# Patient Record
Sex: Female | Born: 1995 | Hispanic: No | Marital: Single | Smoking: Never smoker
Health system: Southern US, Community
[De-identification: ages and names within clinical notes are randomized; demographics above are authoritative.]

---
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
## 2015-01-22 ENCOUNTER — Emergency Department (HOSPITAL_COMMUNITY)
Admission: EM | Admit: 2015-01-22 | Discharge: 2015-01-22 | Discharge status: Absent without leave | Payer: Self-pay | Source: Home / Self Care

## 2015-06-22 NOTE — L&D Delivery Note (Signed)
Delivery Note At 5:30 PM a viable female was delivered via Vaginal, Spontaneous Delivery (Presentation: LOA;  ).  APGAR: 9, 9; weight 4 lb 4.1 oz (1930 g).   Placenta status: delivered whole and intact, with 3-vessel cord without complications.  Anesthesia: none  Episiotomy: None Lacerations:  1st degree, hemostatic Suture Repair: n/a Est. Blood Loss (mL):  100  Mom to postpartum.  Baby to NICU due to low birth weight.  Alyss Granato 06/13/2016, 6:01 PM

## 2015-11-23 ENCOUNTER — Encounter (HOSPITAL_COMMUNITY): Payer: Self-pay

## 2015-11-23 DIAGNOSIS — Z3A01 Less than 8 weeks gestation of pregnancy: Secondary | ICD-10-CM | POA: Diagnosis not present

## 2015-11-23 DIAGNOSIS — O219 Vomiting of pregnancy, unspecified: Secondary | ICD-10-CM | POA: Diagnosis not present

## 2015-11-23 DIAGNOSIS — O209 Hemorrhage in early pregnancy, unspecified: Secondary | ICD-10-CM | POA: Diagnosis present

## 2015-11-23 DIAGNOSIS — Z79899 Other long term (current) drug therapy: Secondary | ICD-10-CM | POA: Diagnosis not present

## 2015-11-23 LAB — I-STAT BETA HCG BLOOD, ED (MC, WL, AP ONLY)

## 2015-11-23 NOTE — ED Notes (Signed)
Pt complaining of vaginal bleeding x 30 mins. Pt states she is [redacted] weeks pregnant. Pt complaining of lower abdominal pain.

## 2015-11-24 ENCOUNTER — Emergency Department (HOSPITAL_COMMUNITY)
Admission: EM | Admit: 2015-11-24 | Discharge: 2015-11-24 | Disposition: A | Discharge status: Home / Self Care | Payer: Medicaid Other | Attending: Emergency Medicine | Admitting: Emergency Medicine

## 2015-11-24 ENCOUNTER — Emergency Department (HOSPITAL_COMMUNITY): Payer: Medicaid Other

## 2015-11-24 DIAGNOSIS — R58 Hemorrhage, not elsewhere classified: Secondary | ICD-10-CM

## 2015-11-24 LAB — WET PREP, GENITAL
CLUE CELLS WET PREP: NONE SEEN
SPERM: NONE SEEN
TRICH WET PREP: NONE SEEN
Yeast Wet Prep HPF POC: NONE SEEN

## 2015-11-24 LAB — GC/CHLAMYDIA PROBE AMP (~~LOC~~) NOT AT ARMC
Chlamydia: NEGATIVE
Neisseria Gonorrhea: NEGATIVE

## 2015-11-24 NOTE — ED Notes (Signed)
Pt in US

## 2015-11-24 NOTE — ED Provider Notes (Signed)
CSN: [id number]     Arrival date & time 11/23/15  2245 History  By signing my name below, I, Marisue Humble, attest that this documentation has been prepared under the direction and in the presence of Tomasita Crumble, MD . Electronically Signed: Marisue Humble, Scribe. 11/24/2015. 1:58 AM.   Chief Complaint  Patient presents with  . Vaginal Bleeding  . Possible Pregnancy    The history is provided by the patient. No language interpreter was used.   HPI Comments:  Barbara Mckinney is a 20 y.o. female with no pertinent PMHx who presents to the Emergency Department complaining of vaginal bleeding onset ~3 hours ago, currently alleviated. She noticed the bleeding with urination but notes it decreases when sitting down. Pt reports associated lower abdominal pain, nausea and vomiting. No alleviating factors noted. She is [redacted] weeks pregnant; this is her first pregnancy. Denies abnormal vaginal discharge.    History reviewed. No pertinent past medical history. Past Surgical History  Procedure Laterality Date  . Laparoscopy N/A 01/07/2013    Procedure: LAPAROSCOPY DIAGNOSTIC;  Surgeon: Robyne Askew, MD;  [facility];  Service: General;  Laterality: N/A;  . 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 No pertinent family history. Social History  Substance Use Topics  . Smoking status: Never Smoker   . Smokeless tobacco: None  . Alcohol Use: No   OB History    No data available     Review of Systems  Gastrointestinal: Positive for nausea, vomiting and abdominal pain.  Genitourinary: Positive for vaginal bleeding. Negative for vaginal discharge.  All other systems reviewed and are negative.   Allergies  Bacitracin  Home Medications   Prior to Admission medications   Medication Sig Start Date End Date Taking?  Authorizing Provider  Prenatal Vit-Fe Fumarate-FA (PRENATAL MULTIVITAMIN) TABS tablet Take 1 tablet by mouth daily at 12 noon.   Yes Historical Provider, MD   BP 120/81 mmHg  Pulse 86  Temp(Src) 98.8 F (37.1 C) (Oral)  Resp 12  Ht  (1.575 m)  Wt 102 lb (46.267 kg)  BMI 18.65 kg/m2  SpO2 100%   Physical Exam  Constitutional: She is oriented to person, place, and time. She appears well-developed and well-nourished. No distress.  HENT:  Head: Normocephalic and atraumatic.  Nose: Nose normal.  Mouth/Throat: Oropharynx is clear and moist. No oropharyngeal exudate.  Eyes: Conjunctivae and EOM are normal. Pupils are equal, round, and reactive to light. No scleral icterus.  Neck: Normal range of motion. Neck supple. No JVD present. No tracheal deviation present. No thyromegaly present.  Cardiovascular: Normal rate, regular rhythm and normal heart sounds.  Exam reveals no gallop and no friction rub.   No murmur heard. Pulmonary/Chest: Effort normal and breath sounds normal. No respiratory distress. She has no wheezes. She exhibits no tenderness.  Abdominal: Soft. Bowel sounds are normal. She exhibits no distension and no mass. There is no tenderness. There is no rebound and no guarding.  mildline abdominal scar, well healed  Genitourinary:  Moderate vaginal bleeding coming from cervical os  Musculoskeletal: Normal range of motion. She exhibits no edema or tenderness.  Lymphadenopathy:    She has no cervical adenopathy.  Neurological: She is alert and oriented to person, place, and time. No cranial nerve deficit. She exhibits normal muscle tone.  Skin: Skin is warm and dry. No rash noted. No erythema. No pallor.  Psychiatric: She has a normal mood and affect. Her behavior is normal.  Nursing note and vitals reviewed.   ED Course  Procedures  DIAGNOSTIC STUDIES:  Oxygen Saturation is 100% on RA, normal by my interpretation.    COORDINATION OF CARE:  1:42 AM Performed pelvic  exam. Discussed treatment plan with pt at bedside and pt agreed to plan.  Labs Review Labs Reviewed  WET PREP, GENITAL - Abnormal; Notable for the following:    WBC, Wet Prep HPF POC FEW (*)    All other components within normal limits  I-STAT BETA HCG BLOOD, ED ([facility], [facility], [facility] ONLY) - Abnormal; Notable for the following:    I-stat hCG, quantitative >2000.0 (*)    All other components within normal limits  GC/CHLAMYDIA PROBE AMP ([place]) NOT AT [facility]    Imaging Review [place] Ob Comp Less 14 Wks  11/24/2015  CLINICAL DATA:  20 year old female with positive HCG levels and vaginal bleeding EXAM: OBSTETRIC <14 WK US AND TRANSVAGINAL OB US TECHNIQUE: Both transabdominal and transvaginal ultrasound examinations were performed for complete evaluation of the gestation as well as the maternal uterus, adnexal regions, and pelvic cul-de-sac. Transvaginal technique was performed to assess early pregnancy. COMPARISON:  None for this pregnancy FINDINGS: Intrauterine gestational sac: Single intrauterine gestational sac. Yolk sac:  Seen Embryo:  Present Cardiac Activity: Detected Heart Rate: 144  bpm CRL:  11  Mm   7 w   2 d                  US EDC: 07/10/2016 Subchorionic hemorrhage: There is a small hypoechoic to anechoic area superior to the gestational sac with apparent partial separation from the gestational sac by a septum. This may represent a small subarachnoid hemorrhage or a band within the gestational sac. Maternal uterus/adnexae: The maternal ovaries appear unremarkable. There is a 2.0 x 1.5 x 1.5 cm hypoechoic structure in the right ovary, likely a corpus luteum. No free fluid noted. IMPRESSION: Single live intrauterine pregnancy with an estimated gestational age of [redacted] weeks, 2 days. Small subchorionic hemorrhage versus septation/ band within the superior aspect of the gestational sac. Follow-up recommended. Electronically Signed   By: Elgie CollardArash  Radparvar M.D.   On: 11/24/2015 01:30   [place] Ob  Transvaginal  11/24/2015  CLINICAL DATA:  20 year old female with positive HCG levels and vaginal bleeding EXAM: OBSTETRIC <14 WK US AND TRANSVAGINAL OB US TECHNIQUE: Both transabdominal and transvaginal ultrasound examinations were performed for complete evaluation of the gestation as well as the maternal uterus, adnexal regions, and pelvic cul-de-sac. Transvaginal technique was performed to assess early pregnancy. COMPARISON:  None for this pregnancy FINDINGS: Intrauterine gestational sac: Single intrauterine gestational sac. Yolk sac:  Seen Embryo:  Present Cardiac Activity: Detected Heart Rate: 144  bpm CRL:  11  Mm   7 w   2 d                  US EDC: 07/10/2016 Subchorionic hemorrhage: There is a small hypoechoic to anechoic area superior to the gestational sac with apparent partial separation from the gestational sac by a septum. This may represent a small subarachnoid hemorrhage or a band within the gestational sac. Maternal uterus/adnexae: The maternal ovaries appear unremarkable. There is a 2.0 x 1.5 x 1.5 cm hypoechoic structure in the right ovary, likely a corpus luteum. No free fluid noted. IMPRESSION: Single live intrauterine pregnancy  with an estimated gestational age of [redacted] weeks, 2 days. Small subchorionic hemorrhage versus septation/ band within the superior aspect of the gestational sac. Follow-up recommended. Electronically Signed   By: Elgie Collard M.D.   On: 11/24/2015 01:30   I have personally reviewed and evaluated these images and lab results as part of my medical decision-making.   EKG Interpretation None      MDM   Final diagnoses:  Bleeding    Patient presents to the ED for vag bleeding in the setting of pregnancy.  PE is unremarkable other than visualizing blood.  US reveal subchorionic hemorrhage which was explained to the patient.  OB fu provided. She appears well and in NAD. Vs remain within her normal limits and she is safe for DC.   I personally performed the  services described in this documentation, which was scribed in my presence. The recorded information has been reviewed and is accurate.     Tomasita Crumble, MD 11/24/15 [phone]

## 2015-11-24 NOTE — Discharge Instructions (Signed)
Vaginal Bleeding During Pregnancy, First Trimester Ms. Barbara Mckinney, your ultrasound reveals a live fetus in your uterus.  See an OB doctor within 3 days for close follow up.  If symptoms worsen, come back to the ED immediately. Thank you. A small amount of bleeding (spotting) from the vagina is common in early pregnancy. Sometimes the bleeding is normal and is not a problem, and sometimes it is a sign of something serious. Be sure to tell your doctor about any bleeding from your vagina right away. HOME CARE  Watch your condition for any changes.  Follow your doctor's instructions about how active you can be.  If you are on bed rest:  You may need to stay in bed and only get up to use the bathroom.  You may be allowed to do some activities.  If you need help, make plans for someone to help you.  Write down:  The number of pads you use each day.  How often you change pads.  How soaked (saturated) your pads are.  Do not use tampons.  Do not douche.  Do not have sex or orgasms until your doctor says it is okay.  If you pass any tissue from your vagina, save the tissue so you can show it to your doctor.  Only take medicines as told by your doctor.  Do not take aspirin because it can make you bleed.  Keep all follow-up visits as told by your doctor. GET HELP IF:   You bleed from your vagina.  You have cramps.  You have labor pains.  You have a fever that does not go away after you take medicine. GET HELP RIGHT AWAY IF:   You have very bad cramps in your back or belly (abdomen).  You pass large clots or tissue from your vagina.  You bleed more.  You feel light-headed or weak.  You pass out (faint).  You have chills.  You are leaking fluid or have a gush of fluid from your vagina.  You pass out while pooping (having a bowel movement). MAKE SURE YOU:  Understand these instructions.  Will watch your condition.  Will get help right away if you are not doing well  or get worse.   This information is not intended to replace advice given to you by your health care provider. Make sure you discuss any questions you have with your health care provider.   Document Released: 10/22/2013 Document Reviewed: 10/22/2013 Elsevier Interactive Patient Education [employer]2016 Elsevier Inc.

## 2015-11-27 ENCOUNTER — Encounter (HOSPITAL_COMMUNITY): Payer: Self-pay

## 2015-11-27 DIAGNOSIS — Z3A01 Less than 8 weeks gestation of pregnancy: Secondary | ICD-10-CM | POA: Diagnosis not present

## 2015-11-27 DIAGNOSIS — Z79899 Other long term (current) drug therapy: Secondary | ICD-10-CM | POA: Diagnosis not present

## 2015-11-27 DIAGNOSIS — O209 Hemorrhage in early pregnancy, unspecified: Secondary | ICD-10-CM | POA: Insufficient documentation

## 2015-11-27 LAB — COMPREHENSIVE METABOLIC PANEL
ALK PHOS: 56 U/L (ref 38–126)
ALT: 11 U/L — AB (ref 14–54)
AST: 16 U/L (ref 15–41)
Albumin: 3.9 g/dL (ref 3.5–5.0)
Anion gap: 5 (ref 5–15)
BILIRUBIN TOTAL: 0.8 mg/dL (ref 0.3–1.2)
BUN: 6 mg/dL (ref 6–20)
CO2: 23 mmol/L (ref 22–32)
Calcium: 9.2 mg/dL (ref 8.9–10.3)
Chloride: 104 mmol/L (ref 101–111)
Creatinine, Ser: 0.57 mg/dL (ref 0.44–1.00)
GFR calc Af Amer: >60 mL/min (ref >60)
GFR calc non Af Amer: >60 mL/min (ref >60)
GLUCOSE: 149 mg/dL — AB (ref 65–99)
Potassium: 3.5 mmol/L (ref 3.5–5.1)
Sodium: 132 mmol/L — ABNORMAL LOW (ref 135–145)
TOTAL PROTEIN: 6.8 g/dL (ref 6.5–8.1)

## 2015-11-27 LAB — LIPASE, BLOOD: Lipase: 26 U/L (ref 11–51)

## 2015-11-27 LAB — URINE MICROSCOPIC-ADD ON: WBC UA: NONE SEEN WBC/hpf (ref 0–5)

## 2015-11-27 LAB — URINALYSIS, ROUTINE W REFLEX MICROSCOPIC
Bilirubin Urine: NEGATIVE
Glucose, UA: NEGATIVE mg/dL
Ketones, ur: NEGATIVE mg/dL
Leukocytes, UA: NEGATIVE
NITRITE: NEGATIVE
PH: 6 (ref 5.0–8.0)
PROTEIN: NEGATIVE mg/dL
Specific Gravity, Urine: 1.019 (ref 1.005–1.030)

## 2015-11-27 LAB — CBC
HCT: 35.8 % — ABNORMAL LOW (ref 36.0–46.0)
Hemoglobin: 12.2 g/dL (ref 12.0–15.0)
MCH: 29.5 pg (ref 26.0–34.0)
MCHC: 34.1 g/dL (ref 30.0–36.0)
MCV: 86.7 fL (ref 78.0–100.0)
Platelets: 222 10*3/uL (ref 150–400)
RBC: 4.13 MIL/uL (ref 3.87–5.11)
RDW: 11.8 % (ref 11.5–15.5)
WBC: 7.6 10*3/uL (ref 4.0–10.5)

## 2015-11-27 LAB — I-STAT BETA HCG BLOOD, ED (MC, WL, AP ONLY): I-stat hCG, quantitative: >2000 m[IU]/mL — ABNORMAL HIGH (ref <5)

## 2015-11-27 NOTE — ED Notes (Signed)
Pt reports she is [redacted] weeks pregnant and tonight around 9pm she started to experience vaginal bleeding "like a normal period" associated with abdominal pain. LMP in March.

## 2015-11-28 ENCOUNTER — Emergency Department (HOSPITAL_COMMUNITY): Payer: Medicaid Other

## 2015-11-28 ENCOUNTER — Emergency Department (HOSPITAL_COMMUNITY)
Admission: EM | Admit: 2015-11-28 | Discharge: 2015-11-28 | Disposition: A | Discharge status: Home / Self Care | Payer: Medicaid Other | Attending: Emergency Medicine | Admitting: Emergency Medicine

## 2015-11-28 DIAGNOSIS — O209 Hemorrhage in early pregnancy, unspecified: Secondary | ICD-10-CM

## 2015-11-28 DIAGNOSIS — N939 Abnormal uterine and vaginal bleeding, unspecified: Secondary | ICD-10-CM

## 2015-11-28 NOTE — ED Provider Notes (Signed)
CSN: [id number]     Arrival date & time 11/27/15  2126 History  By signing my name below, I, Linna Darner, attest that this documentation has been prepared under the direction and in the presence of physician practitioner, Gwyneth Sprout, MD. Electronically Signed: Linna Darner, Scribe. 11/28/2015. 2:02 AM.    Chief Complaint  Patient presents with  . Vaginal Bleeding    The history is provided by the patient. No language interpreter was used.     HPI Comments: Barbara Mckinney is a 20 y.o. female who presents to the Emergency Department complaining of sudden onset, intermittent, vaginal bleeding beginning several hours ago. She is [redacted] weeks pregnant. She states that she was seen here a few days ago for the same reason; she notes that her vaginal bleeding stopped after that visit but started again tonight. She reports that her bleeding is "like a normal period." She reports that she is not bleeding currently. Pt states that her abdomen is not in pain currently but she experienced abdominal pain earlier tonight. She notes associated nausea as well. Pt had not exerted herself or had sexual intercourse prior to onset. She has no h/o prior pregnancy. Her LNMP was in March of this year. She denies vomiting or any other associated symptoms.  History reviewed. No pertinent past medical history. Past Surgical History  Procedure Laterality Date  . Laparoscopy N/A 01/07/2013    Procedure: LAPAROSCOPY DIAGNOSTIC;  Surgeon: Robyne Askew, MD;  Location: [facility] OR;  Service: General;  Laterality: N/A;  . 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 Social History  Substance Use Topics  . Smoking status: Never Smoker   . Smokeless tobacco: None  . Alcohol Use: No   OB History    Gravida Para Term Preterm AB TAB SAB Ectopic  Multiple Living   1              Review of Systems  A complete 10 system review of systems was obtained and all systems are negative except as noted in the HPI and PMH.   Allergies  Bacitracin  Home Medications   Prior to Admission medications   Medication Sig Start Date End Date Taking? Authorizing Provider  Prenatal Vit-Fe Fumarate-FA (PRENATAL MULTIVITAMIN) TABS tablet Take 1 tablet by mouth daily at 12 noon.   Yes Historical Provider, MD   BP 107/80 mmHg  Pulse 74  Temp(Src) 98 F (36.7 C) (Oral)  Resp 22  Ht 5\' 2"  (1.575 m)  Wt 101 lb 3 oz (45.898 kg)  BMI 18.50 kg/m2  SpO2 100% Physical Exam  Constitutional: She is oriented to person, place, and time. She appears well-developed and well-nourished. No distress.  HENT:  Head: Normocephalic and atraumatic.  Eyes: Conjunctivae and EOM are normal.  Neck: Neck supple. No tracheal deviation present.  Cardiovascular: Normal rate, regular rhythm and normal heart sounds.   Pulmonary/Chest: Effort normal and breath sounds normal. No respiratory distress.  Abdominal: Bowel sounds are normal. She exhibits no distension and no mass. There is no tenderness. There is no rebound and no guarding.  Genitourinary: Vagina normal.  Musculoskeletal: Normal range of motion.  Neurological: She is alert and oriented to person, place, and time.  Skin: Skin is warm and dry.  Psychiatric: She has a normal mood and affect. Her  behavior is normal.  Nursing note and vitals reviewed.   ED Course  Procedures (including critical care time)  DIAGNOSTIC STUDIES: Oxygen Saturation is 100% on RA, normal by my interpretation.    COORDINATION OF CARE: 2:02 AM Discussed treatment plan with pt at bedside and pt agreed to plan.  Labs Review Labs Reviewed  COMPREHENSIVE METABOLIC PANEL - Abnormal; Notable for the following:    Sodium 132 (*)    Glucose, Bld 149 (*)    ALT 11 (*)    All other components within normal limits  CBC - Abnormal;  Notable for the following:    HCT 35.8 (*)    All other components within normal limits  URINALYSIS, ROUTINE W REFLEX MICROSCOPIC (NOT AT [facility]) - Abnormal; Notable for the following:    Hgb urine dipstick LARGE (*)    All other components within normal limits  URINE MICROSCOPIC-ADD ON - Abnormal; Notable for the following:    Squamous Epithelial / LPF 0-5 (*)    Bacteria, UA RARE (*)    All other components within normal limits  I-STAT BETA HCG BLOOD, ED ([facility], [facility], [facility] ONLY) - Abnormal; Notable for the following:    I-stat hCG, quantitative >2000.0 (*)    All other components within normal limits  LIPASE, BLOOD    Imaging Review [place] Ob Transvaginal  11/28/2015  CLINICAL DATA:  20 year old pregnant female with vaginal bleeding. EXAM: TRANSVAGINAL OB ULTRASOUND TECHNIQUE: Transvaginal ultrasound was performed for complete evaluation of the gestation as well as the maternal uterus, adnexal regions, and pelvic cul-de-sac. COMPARISON:  Ultrasound dated 11/24/2015 FINDINGS: Intrauterine gestational sac: Single intrauterine gestational sac. Yolk sac:  Seen Embryo:  Present Cardiac Activity: Detected Heart Rate: 169 bpm CRL:   17  mm   8 w 1 d                  US EDC: 07/08/2016 Subchorionic hemorrhage: Small subchorionic hemorrhage measuring approximately 4.5 x 4.5 mm . Maternal uterus/adnexae: The right ovary measures 3.2 x 2.5 x 2.3 cm. There is a 1.8 x 1.4 x 1.7 cm corpus luteum in the right ovary. The left ovary measures 2.4 x 1.4 x 1.7 cm and appears unremarkable. IMPRESSION: Single live intrauterine pregnancy with an estimated gestational age of [redacted] weeks, 6 days based on first ultrasound dated 11/24/2015. Small subchorionic hemorrhage, decreased in size compared to prior study. Electronically Signed   By: Elgie CollardArash  Radparvar M.D.   On: 11/28/2015 01:45   I have personally reviewed and evaluated these images and lab results as part of my medical decision-making.   EKG Interpretation None      MDM    Final diagnoses:  Vaginal bleeding in pregnancy, first trimester    Patient is a healthy 20 year old female who was seen several days ago and diagnosed with IUP and first trimester vaginal bleeding returning today due to heavy bleeding and mild abdominal cramping. Currently patient has no bleeding and no cramping at this time. Labs and ultrasound ordered prior to my examination showed a single live intrauterine pregnancy with gestational age of [redacted] weeks 6 days with a small subchorionic hemorrhage. Patient has no abdominal pain on exam currently she recently had a pelvic exam and do not feel she needs repeated at this time. No evidence of ectopic pregnancy. HCG is still greater than 2000. Patient was encouraged to follow-up with women's clinic and she was discharged home.  I personally performed the services described in this documentation, which was scribed in  my presence.  The recorded information has been reviewed and considered.   Gwyneth Sprout, MD 11/28/15 [phone]

## 2015-11-28 NOTE — ED Notes (Signed)
Patient transported to Ultrasound 

## 2015-12-19 ENCOUNTER — Encounter (HOSPITAL_COMMUNITY): Payer: Self-pay | Admitting: *Deleted

## 2015-12-19 ENCOUNTER — Emergency Department (HOSPITAL_COMMUNITY)
Admission: EM | Admit: 2015-12-19 | Discharge: 2015-12-20 | Disposition: A | Discharge status: Home / Self Care | Payer: Medicaid Other | Attending: Emergency Medicine | Admitting: Emergency Medicine

## 2015-12-19 DIAGNOSIS — O210 Mild hyperemesis gravidarum: Secondary | ICD-10-CM | POA: Insufficient documentation

## 2015-12-19 DIAGNOSIS — Z3A11 11 weeks gestation of pregnancy: Secondary | ICD-10-CM | POA: Diagnosis not present

## 2015-12-19 LAB — BASIC METABOLIC PANEL
Anion gap: 9 (ref 5–15)
BUN: 6 mg/dL (ref 6–20)
CALCIUM: 9.2 mg/dL (ref 8.9–10.3)
CHLORIDE: 103 mmol/L (ref 101–111)
CO2: 23 mmol/L (ref 22–32)
CREATININE: 0.58 mg/dL (ref 0.44–1.00)
GFR calc non Af Amer: >60 mL/min (ref >60)
Glucose, Bld: 89 mg/dL (ref 65–99)
Potassium: 3.4 mmol/L — ABNORMAL LOW (ref 3.5–5.1)
SODIUM: 135 mmol/L (ref 135–145)

## 2015-12-19 MED ORDER — SODIUM CHLORIDE 0.9 % IV BOLUS (SEPSIS)
1000.0000 mL | Freq: Once | INTRAVENOUS | Status: AC
  Administered 2015-12-19: 1000 mL via INTRAVENOUS

## 2015-12-19 MED ORDER — ONDANSETRON HCL 4 MG/2ML IJ SOLN
4.0000 mg | Freq: Once | INTRAMUSCULAR | Status: AC
  Administered 2015-12-19: 4 mg via INTRAVENOUS
  Filled 2015-12-19: qty 2

## 2015-12-19 NOTE — ED Notes (Signed)
THE PT IS ALSO C/O A HEADACHE

## 2015-12-19 NOTE — ED Notes (Signed)
THE PT IS C/O DIZZINESS AND EMESIS FOR 2 WEEKS. SHE IS [redacted] WEEKS PREGNANT BUT HAS NOT BEEN ABLE TO MAKE A OB APPOINTMENT.  SHE WAS SEEN AT [facility] AND WAS TOLD TO CALL THE CLINIC BUT THEY CALLED AND NO APPOINTMENTS WERE AVAILABLE  LMP [place]

## 2015-12-20 MED ORDER — ONDANSETRON 4 MG PO TBDP: 4.0000 mg | ORAL_TABLET | Freq: Three times a day (TID) | ORAL | Status: DC | PRN

## 2015-12-20 NOTE — ED Notes (Signed)
Pt stable, ambulatory, states understanding of discharge instructions 

## 2015-12-20 NOTE — Discharge Instructions (Signed)
Hyperemesis Gravidarum  Hyperemesis gravidarum is a severe form of nausea and vomiting that happens during pregnancy. Hyperemesis is worse than morning sickness. It may cause you to have nausea or vomiting all day for many days. It may keep you from eating and drinking enough food and liquids. Hyperemesis usually occurs during the first half (the first 20 weeks) of pregnancy. It often goes away once a woman is in her second half of pregnancy. However, sometimes hyperemesis continues through an entire pregnancy.   CAUSES   The cause of this condition is not completely known but is thought to be related to changes in the body's hormones when pregnant. It could be from the high level of the pregnancy hormone or an increase in estrogen in the body.   SIGNS AND SYMPTOMS    Severe nausea and vomiting.   Nausea that does not go away.   Vomiting that does not allow you to keep any food down.   Weight loss and body fluid loss (dehydration).   Having no desire to eat or not liking food you have previously enjoyed.  DIAGNOSIS   Your health care provider will do a physical exam and ask you about your symptoms. He or she may also order blood tests and urine tests to make sure something else is not causing the problem.   TREATMENT   You may only need medicine to control the problem. If medicines do not control the nausea and vomiting, you will be treated in the hospital to prevent dehydration, increased acid in the blood (acidosis), weight loss, and changes in the electrolytes in your body that may harm the unborn baby (fetus). You may need IV fluids.   HOME CARE INSTRUCTIONS    Only take over-the-counter or prescription medicines as directed by your health care provider.   Try eating a couple of dry crackers or toast in the morning before getting out of bed.   Avoid foods and smells that upset your stomach.   Avoid fatty and spicy foods.   Eat 5-6 small meals a day.   Do not drink when eating meals. Drink between  meals.   For snacks, eat high-protein foods, such as cheese.   Eat or suck on things that have ginger in them. Ginger helps nausea.   Avoid food preparation. The smell of food can spoil your appetite.   Avoid iron pills and iron in your multivitamins until after 3-4 months of being pregnant. However, consult with your health care provider before stopping any prescribed iron pills.  SEEK MEDICAL CARE IF:    Your abdominal pain increases.   You have a severe headache.   You have vision problems.   You are losing weight.  SEEK IMMEDIATE MEDICAL CARE IF:    You are unable to keep fluids down.   You vomit blood.   You have constant nausea and vomiting.   You have excessive weakness.   You have extreme thirst.   You have dizziness or fainting.   You have a fever or persistent symptoms for more than 2-3 days.   You have a fever and your symptoms suddenly get worse.  MAKE SURE YOU:    Understand these instructions.   Will watch your condition.   Will get help right away if you are not doing well or get worse.     This information is not intended to replace advice given to you by your health care provider. Make sure you discuss any questions you have with   your health care provider.     Document Released: 06/07/2005 Document Revised: 03/28/2013 Document Reviewed: 01/17/2013  Elsevier Interactive Patient Education 2016 Elsevier Inc.

## 2015-12-25 NOTE — ED Provider Notes (Signed)
CSN: [id number]     Arrival date & time 12/19/15  1844 History   First MD Initiated Contact with Patient 12/19/15 2235     Chief Complaint  Patient presents with  . Emesis     (Consider location/radiation/quality/duration/timing/severity/associated sxs/prior Treatment) HPI Comments: The patient presents with nausea and vomiting that has been persistent over 2 weeks. She states she is about [redacted] weeks pregnant and, so far, has not started prenatal care. No fever. She had some vaginal bleeding and was evaluated for same on 11/28/15 and reports a normal ultrasound. No further vaginal bleeding. No vaginal discharge or abdominal pain. She has been lightheaded/dizzy without syncope or near syncope. No SOB or chest pain. No diarrhea.  The history is provided by the patient. No language interpreter was used.    History reviewed. No pertinent past medical history. Past Surgical History  Procedure Laterality Date  . Laparoscopy N/A 01/07/2013    Procedure: LAPAROSCOPY DIAGNOSTIC;  Surgeon: Robyne AskewPaul S Toth III, MD;  [facility];  Service: General;  Laterality: N/A;  . 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 Social History  Substance Use Topics  . Smoking status: Never Smoker   . Smokeless tobacco: None  . Alcohol Use: No   OB History    Gravida Para Term Preterm AB TAB SAB Ectopic Multiple Living   1              Review of Systems  Constitutional: Negative for fever and chills.  HENT: Negative.   Respiratory: Negative.  Negative for shortness of breath.   Cardiovascular: Negative.   Gastrointestinal: Positive for nausea and vomiting. Negative for abdominal pain and diarrhea.  Genitourinary: Negative for dysuria, vaginal bleeding and vaginal discharge.  Musculoskeletal: Negative.  Negative for myalgias.   Skin: Negative.  Negative for color change and pallor.  Neurological: Positive for dizziness and light-headedness. Negative for syncope.      Allergies  Bacitracin  Home Medications   Prior to Admission medications   Medication Sig Start Date End Date Taking? Authorizing Provider  ondansetron (ZOFRAN ODT) 4 MG disintegrating tablet Take 1 tablet (4 mg total) by mouth every 8 (eight) hours as needed for nausea or vomiting. 12/20/15   Alayziah Tangeman, PA-C   BP 107/83 mmHg  Pulse 72  Temp(Src) 98.2 F (36.8 C) (Oral)  Resp 19  Ht 5\' 2"  (1.575 m)  Wt 43.817 kg  BMI 17.66 kg/m2  SpO2 100% Physical Exam  Constitutional: She is oriented to person, place, and time. She appears well-developed and well-nourished.  HENT:  Head: Normocephalic.  Neck: Normal range of motion. Neck supple.  Cardiovascular: Normal rate and regular rhythm.   Pulmonary/Chest: Effort normal and breath sounds normal. She has no wheezes. She has no rales.  Abdominal: Soft. Bowel sounds are normal. There is no tenderness. There is no rebound and no guarding.  Musculoskeletal: Normal range of motion.  Neurological: She is alert and oriented to person, place, and time.  Skin: Skin is warm and dry. No rash noted.  Psychiatric: She has a normal mood and affect.    ED Course  Procedures (including critical care time) Labs Review Labs Reviewed  BASIC METABOLIC PANEL - Abnormal; Notable for the following:    Potassium 3.4 (*)    All other components within  normal limits    Imaging Review No results found. I have personally reviewed and evaluated these images and lab results as part of my medical decision-making.   EKG Interpretation None      MDM   Final diagnoses:  Hyperemesis gravidarum    IVF's and Zofran provided with improvement in symptoms. She is tolerating PO fluids. She is encouraged to seek prenatal care and an additional referral was given to her at discharge. She states she feels improved  enough to be comfortable with discharge home.     Elpidio AnisShari Jazzmen Restivo, PA-C 12/25/15 0032  Loren Raceravid Yelverton, MD 12/26/15 [phone]

## 2016-06-10 ENCOUNTER — Encounter (HOSPITAL_COMMUNITY): Payer: Self-pay

## 2016-06-10 ENCOUNTER — Inpatient Hospital Stay (HOSPITAL_COMMUNITY)
Admission: AD | Admit: 2016-06-10 | Discharge: 2016-06-10 | Disposition: A | Discharge status: Home / Self Care | Payer: Medicaid Other | Source: Ambulatory Visit | Attending: Obstetrics & Gynecology | Admitting: Obstetrics & Gynecology

## 2016-06-10 ENCOUNTER — Inpatient Hospital Stay (HOSPITAL_COMMUNITY): Payer: Medicaid Other

## 2016-06-10 DIAGNOSIS — O139 Gestational [pregnancy-induced] hypertension without significant proteinuria, unspecified trimester: Secondary | ICD-10-CM

## 2016-06-10 DIAGNOSIS — O133 Gestational [pregnancy-induced] hypertension without significant proteinuria, third trimester: Secondary | ICD-10-CM | POA: Insufficient documentation

## 2016-06-10 DIAGNOSIS — Z3A35 35 weeks gestation of pregnancy: Secondary | ICD-10-CM | POA: Insufficient documentation

## 2016-06-10 DIAGNOSIS — O163 Unspecified maternal hypertension, third trimester: Secondary | ICD-10-CM

## 2016-06-10 DIAGNOSIS — O4703 False labor before 37 completed weeks of gestation, third trimester: Secondary | ICD-10-CM

## 2016-06-10 LAB — COMPREHENSIVE METABOLIC PANEL
ALT: 13 U/L — AB (ref 14–54)
ANION GAP: 9 (ref 5–15)
AST: 18 U/L (ref 15–41)
Albumin: 2.9 g/dL — ABNORMAL LOW (ref 3.5–5.0)
Alkaline Phosphatase: 138 U/L — ABNORMAL HIGH (ref 38–126)
BUN: 9 mg/dL (ref 6–20)
CHLORIDE: 108 mmol/L (ref 101–111)
CO2: 18 mmol/L — AB (ref 22–32)
Calcium: 8.9 mg/dL (ref 8.9–10.3)
Creatinine, Ser: 0.67 mg/dL (ref 0.44–1.00)
GFR calc non Af Amer: >60 mL/min (ref >60)
Glucose, Bld: 87 mg/dL (ref 65–99)
POTASSIUM: 3.9 mmol/L (ref 3.5–5.1)
SODIUM: 135 mmol/L (ref 135–145)
Total Bilirubin: 0.1 mg/dL — ABNORMAL LOW (ref 0.3–1.2)
Total Protein: 6.7 g/dL (ref 6.5–8.1)

## 2016-06-10 LAB — URINALYSIS, ROUTINE W REFLEX MICROSCOPIC
BILIRUBIN URINE: NEGATIVE
Glucose, UA: NEGATIVE mg/dL
Hgb urine dipstick: NEGATIVE
KETONES UR: NEGATIVE mg/dL
Leukocytes, UA: NEGATIVE
NITRITE: NEGATIVE
PH: 7 (ref 5.0–8.0)
Protein, ur: NEGATIVE mg/dL
Specific Gravity, Urine: 1.003 — ABNORMAL LOW (ref 1.005–1.030)

## 2016-06-10 LAB — PROTEIN / CREATININE RATIO, URINE: Creatinine, Urine: 21 mg/dL

## 2016-06-10 LAB — CBC
HCT: 36.5 % (ref 36.0–46.0)
Hemoglobin: 12.9 g/dL (ref 12.0–15.0)
MCH: 31.2 pg (ref 26.0–34.0)
MCHC: 35.3 g/dL (ref 30.0–36.0)
MCV: 88.2 fL (ref 78.0–100.0)
PLATELETS: 191 10*3/uL (ref 150–400)
RBC: 4.14 MIL/uL (ref 3.87–5.11)
RDW: 13 % (ref 11.5–15.5)
WBC: 13.1 10*3/uL — ABNORMAL HIGH (ref 4.0–10.5)

## 2016-06-10 MED ORDER — FENTANYL CITRATE (PF) 100 MCG/2ML IJ SOLN
50.0000 ug | Freq: Once | INTRAMUSCULAR | Status: AC | PRN
  Administered 2016-06-10: 50 ug via INTRAMUSCULAR
  Filled 2016-06-10: qty 2

## 2016-06-10 MED ORDER — BETAMETHASONE SOD PHOS & ACET 6 (3-3) MG/ML IJ SUSP
12.0000 mg | Freq: Once | INTRAMUSCULAR | Status: AC
  Administered 2016-06-10: 12 mg via INTRAMUSCULAR
  Filled 2016-06-10: qty 2

## 2016-06-10 NOTE — MAU Note (Signed)
Pt presents complaining of contractions every 5-10 minutes since 1030 last night. Denies leaking or bleeding. Reports good fetal movement.

## 2016-06-10 NOTE — Discharge Instructions (Signed)
Hypertension During Pregnancy Hypertension is also called high blood pressure. High blood pressure means that the force of your blood moving in your body is too strong. When you are pregnant, this condition should be watched carefully. It can cause problems for you and your baby. Follow these instructions at home: Eating and drinking  Drink enough fluid to keep your pee (urine) clear or pale yellow.  Eat healthy foods that are low in salt (sodium). ? Do not add salt to your food. ? Check labels on foods and drinks to see much salt is in them. Look on the label where you see "Sodium." Lifestyle  Do not use any products that contain nicotine or tobacco, such as cigarettes and e-cigarettes. If you need help quitting, ask your doctor.  Do not use alcohol.  Avoid caffeine.  Avoid stress. Rest and get plenty of sleep. General instructions  Take over-the-counter and prescription medicines only as told by your doctor.  While lying down, lie on your left side. This keeps pressure off your baby.  While sitting or lying down, raise (elevate) your feet. Try putting some pillows under your lower legs.  Exercise regularly. Ask your doctor what kinds of exercise are best for you.  Keep all prenatal and follow-up visits as told by your doctor. This is important. Contact a doctor if:  You have symptoms that your doctor told you to watch for, such as: ? Fever. ? Throwing up (vomiting). ? Headache. Get help right away if:  You have very bad pain in your belly (abdomen).  You are throwing up, and this does not get better with treatment.  You suddenly get swelling in your hands, ankles, or face.  You gain 4 lb (1.8 kg) or more in 1 week.  You get bleeding from your vagina.  You have blood in your pee.  You do not feel your baby moving as much as normal.  You have a change in vision.  You have muscle twitching or sudden tightening (spasms).  You have trouble breathing.  Your lips  or fingernails turn blue. This information is not intended to replace advice given to you by your health care provider. Make sure you discuss any questions you have with your health care provider. Document Released: 07/10/2010 Document Revised: 02/17/2016 Document Reviewed: 02/17/2016 Elsevier Interactive Patient Education  2017 Else[employer] Inc.  

## 2016-06-10 NOTE — Progress Notes (Signed)
Notified of pt arrival in MAU and complaint. Will come see pt 

## 2016-06-10 NOTE — MAU Provider Note (Signed)
  History     CSN: [id number]  Arrival date and time: 06/10/16 [id number]    First Provider Initiated Contact with Patient 06/10/16 [phone]     Chief Complaint  Patient presents with  . Contractions   20 y/o G1P0 at 35+5. Ctx started at 10:30-11. No LOF, no vaginal bleeding. Good fetal movement. Has been ambulating and drinking water which has helped with contractions. Has had braxton hicks ctx, but reports that these are stronger. No recent illness, nausea, vomiting, diarrhea, constipation, or dysuria.    OB History    Gravida Para Term Preterm AB Living   1             SAB TAB Ectopic Multiple Live Births                  History reviewed. No pertinent past medical history.  Past Surgical History:  Procedure Laterality Date  . LAPAROSCOPY N/A 01/07/2013   Procedure: LAPAROSCOPY DIAGNOSTIC;  Surgeon: Robyne AskewPaul S Toth III, MD;  [facility];  Service: General;  Laterality: N/A;  . 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 No pertinent family history.  Social History  Substance Use Topics  . Smoking status: Never Smoker  . Smokeless tobacco: Never Used  . Alcohol use No    Allergies:  Allergies  Allergen Reactions  . Bacitracin Rash    Prescriptions Prior to Admission  Medication Sig Dispense Refill Last Dose  . ondansetron (ZOFRAN ODT) 4 MG disintegrating tablet Take 1 tablet (4 mg total) by mouth every 8 (eight) hours as needed for nausea or vomiting. 20 tablet 0     Review of Systems  Respiratory: Negative for cough.   Gastrointestinal: Negative for constipation, diarrhea, nausea and vomiting.  Genitourinary: Negative for dysuria.   Physical Exam   Blood pressure 126/93, pulse 98, temperature 98.2 F (36.8 C), temperature source Oral, resp. rate 18.  Physical Exam  Constitutional: She is  oriented to person, place, and time. She appears well-developed and well-nourished. No distress.  Cardiovascular: Normal rate, regular rhythm and normal heart sounds.   Respiratory: Effort normal and breath sounds normal.  Genitourinary:  Genitourinary Comments: SVE: 1.5/80/-2  Neurological: She is alert and oriented to person, place, and time.    MAU Course  Procedures  MDM Trial PO hydration Recheck cervix in 1 hr BPP ordered Care turned over to Dr. Omer JackMumaw  Assessment and Plan  20 y/o G1P0 at 35+5 presenting with complaints of contractions  Clearance Cootsndrew Tyson 06/10/2016, 4:59 AM   Dorathy KinsmanVirginia Smith, CNM discussed new Dx Gestational HTN w/ Pt. Pre-E labs ordered, Nml. Discussed Hx. Exam, labs, F/U plans w/ Dr. Macon LargeAnyanwu. [facility], but since no appts available in 3-4 days 2/2 holiday, will get BPP in MAU now. Needs growth US OP.   [place] Paul[place]ia Smith, CNM 06/10/2016 9:44 AM  Pateints BPP was 8/8. Still having some contractions, but seem to be improving. Should return to clinic tomorrow AM for second betamethosone. BP WNL now. OK to D/C/ Home.  Ernestina PennaNicholas Niylah Hassan, MD

## 2016-06-11 ENCOUNTER — Ambulatory Visit (INDEPENDENT_AMBULATORY_CARE_PROVIDER_SITE_OTHER): Payer: Medicaid Other | Admitting: *Deleted

## 2016-06-11 MED ORDER — BETAMETHASONE SOD PHOS & ACET 6 (3-3) MG/ML IJ SUSP
12.0000 mg | Freq: Once | INTRAMUSCULAR | Status: AC
  Administered 2016-06-11: 12 mg via INTRAMUSCULAR

## 2016-06-11 NOTE — Progress Notes (Signed)
Patient presents to clinic for second betamethasone injection. Tolerated well. Stated she continues to have contrations but not a frequent as before, maybe 2/half hour. Advised that if patient experiences more than 5/hour, pain increases, there is vaginal bleeding or fluid leaking, then she should proceed to the MAU to be checked. Understanding voiced.

## 2016-06-13 ENCOUNTER — Inpatient Hospital Stay (HOSPITAL_COMMUNITY)
Admission: AD | Admit: 2016-06-13 | Discharge: 2016-06-15 | DRG: 775 | Disposition: A | Discharge status: Home / Self Care | Payer: Medicaid Other | Source: Ambulatory Visit | Attending: Obstetrics & Gynecology | Admitting: Obstetrics & Gynecology

## 2016-06-13 ENCOUNTER — Inpatient Hospital Stay (HOSPITAL_COMMUNITY): Payer: Medicaid Other | Admitting: Anesthesiology

## 2016-06-13 ENCOUNTER — Encounter (HOSPITAL_COMMUNITY): Payer: Self-pay | Admitting: Certified Nurse Midwife

## 2016-06-13 DIAGNOSIS — O99344 Other mental disorders complicating childbirth: Secondary | ICD-10-CM | POA: Diagnosis present

## 2016-06-13 DIAGNOSIS — F411 Generalized anxiety disorder: Secondary | ICD-10-CM | POA: Diagnosis present

## 2016-06-13 DIAGNOSIS — Z3A36 36 weeks gestation of pregnancy: Secondary | ICD-10-CM | POA: Diagnosis not present

## 2016-06-13 LAB — CBC
HCT: 36.3 % (ref 36.0–46.0)
HEMOGLOBIN: 12.7 g/dL (ref 12.0–15.0)
MCH: 30.8 pg (ref 26.0–34.0)
MCHC: 35 g/dL (ref 30.0–36.0)
MCV: 87.9 fL (ref 78.0–100.0)
PLATELETS: 231 10*3/uL (ref 150–400)
RBC: 4.13 MIL/uL (ref 3.87–5.11)
RDW: 13.1 % (ref 11.5–15.5)
WBC: 14.3 10*3/uL — ABNORMAL HIGH (ref 4.0–10.5)

## 2016-06-13 LAB — TYPE AND SCREEN
ABO/RH(D): A POS
ANTIBODY SCREEN: NEGATIVE

## 2016-06-13 LAB — GROUP B STREP BY PCR: GROUP B STREP BY PCR: NEGATIVE

## 2016-06-13 LAB — ABO/RH: ABO/RH(D): A POS

## 2016-06-13 MED ORDER — DIPHENHYDRAMINE HCL 50 MG/ML IJ SOLN: 12.5000 mg | INTRAMUSCULAR | Status: DC | PRN

## 2016-06-13 MED ORDER — OXYCODONE-ACETAMINOPHEN 5-325 MG PO TABS: 1.0000 | ORAL_TABLET | ORAL | Status: DC | PRN

## 2016-06-13 MED ORDER — FENTANYL CITRATE (PF) 100 MCG/2ML IJ SOLN
INTRAMUSCULAR | Status: AC
  Administered 2016-06-13: 50 ug via INTRAVENOUS
  Filled 2016-06-13: qty 2

## 2016-06-13 MED ORDER — FLEET ENEMA 7-19 GM/118ML RE ENEM: 1.0000 | ENEMA | RECTAL | Status: DC | PRN

## 2016-06-13 MED ORDER — BETAMETHASONE SOD PHOS & ACET 6 (3-3) MG/ML IJ SUSP: 12.0000 mg | Freq: Once | INTRAMUSCULAR | Status: DC

## 2016-06-13 MED ORDER — PRENATAL MULTIVITAMIN CH
1.0000 | ORAL_TABLET | Freq: Every day | ORAL | Status: DC
  Administered 2016-06-14 – 2016-06-15 (×2): 1 via ORAL
  Filled 2016-06-13 (×2): qty 1

## 2016-06-13 MED ORDER — PENICILLIN G POTASSIUM 5000000 UNITS IJ SOLR
5.0000 10*6.[IU] | Freq: Once | INTRAVENOUS | Status: AC
  Administered 2016-06-13: 5 10*6.[IU] via INTRAVENOUS
  Filled 2016-06-13: qty 5

## 2016-06-13 MED ORDER — ONDANSETRON HCL 4 MG/2ML IJ SOLN: 4.0000 mg | INTRAMUSCULAR | Status: DC | PRN

## 2016-06-13 MED ORDER — FENTANYL 2.5 MCG/ML BUPIVACAINE 1/10 % EPIDURAL INFUSION (WH - ANES)
14.0000 mL/h | INTRAMUSCULAR | Status: DC | PRN
  Administered 2016-06-13: 14 mL/h via EPIDURAL
  Filled 2016-06-13: qty 100

## 2016-06-13 MED ORDER — NIFEDIPINE 10 MG PO CAPS
10.0000 mg | ORAL_CAPSULE | Freq: Once | ORAL | Status: AC
  Administered 2016-06-13: 10 mg via ORAL
  Filled 2016-06-13: qty 1

## 2016-06-13 MED ORDER — DIPHENHYDRAMINE HCL 25 MG PO CAPS: 25.0000 mg | ORAL_CAPSULE | Freq: Four times a day (QID) | ORAL | Status: DC | PRN

## 2016-06-13 MED ORDER — SOD CITRATE-CITRIC ACID 500-334 MG/5ML PO SOLN: 30.0000 mL | ORAL | Status: DC | PRN

## 2016-06-13 MED ORDER — DIBUCAINE 1 % RE OINT: 1.0000 "application " | TOPICAL_OINTMENT | RECTAL | Status: DC | PRN

## 2016-06-13 MED ORDER — IBUPROFEN 600 MG PO TABS
600.0000 mg | ORAL_TABLET | Freq: Four times a day (QID) | ORAL | Status: DC
  Administered 2016-06-13 – 2016-06-15 (×8): 600 mg via ORAL
  Filled 2016-06-13 (×9): qty 1

## 2016-06-13 MED ORDER — BENZOCAINE-MENTHOL 20-0.5 % EX AERO: 1.0000 "application " | INHALATION_SPRAY | CUTANEOUS | Status: DC | PRN

## 2016-06-13 MED ORDER — EPHEDRINE 5 MG/ML INJ
10.0000 mg | INTRAVENOUS | Status: DC | PRN
  Filled 2016-06-13: qty 4

## 2016-06-13 MED ORDER — ACETAMINOPHEN 325 MG PO TABS: 650.0000 mg | ORAL_TABLET | ORAL | Status: DC | PRN

## 2016-06-13 MED ORDER — FENTANYL CITRATE (PF) 100 MCG/2ML IJ SOLN
50.0000 ug | INTRAMUSCULAR | Status: DC | PRN
  Administered 2016-06-13: 100 ug via INTRAVENOUS
  Administered 2016-06-13: 50 ug via INTRAVENOUS
  Filled 2016-06-13: qty 2

## 2016-06-13 MED ORDER — LIDOCAINE HCL (PF) 1 % IJ SOLN
30.0000 mL | INTRAMUSCULAR | Status: DC | PRN
  Filled 2016-06-13: qty 30

## 2016-06-13 MED ORDER — OXYCODONE-ACETAMINOPHEN 5-325 MG PO TABS: 2.0000 | ORAL_TABLET | ORAL | Status: DC | PRN

## 2016-06-13 MED ORDER — LACTATED RINGERS IV SOLN
INTRAVENOUS | Status: DC
  Administered 2016-06-13 (×3): via INTRAVENOUS

## 2016-06-13 MED ORDER — PHENYLEPHRINE 40 MCG/ML (10ML) SYRINGE FOR IV PUSH (FOR BLOOD PRESSURE SUPPORT)
80.0000 ug | PREFILLED_SYRINGE | INTRAVENOUS | Status: DC | PRN
  Filled 2016-06-13: qty 5
  Filled 2016-06-13: qty 10

## 2016-06-13 MED ORDER — SIMETHICONE 80 MG PO CHEW: 80.0000 mg | CHEWABLE_TABLET | ORAL | Status: DC | PRN

## 2016-06-13 MED ORDER — TETANUS-DIPHTH-ACELL PERTUSSIS 5-2.5-18.5 LF-MCG/0.5 IM SUSP: 0.5000 mL | Freq: Once | INTRAMUSCULAR | Status: DC

## 2016-06-13 MED ORDER — PENICILLIN G POT IN DEXTROSE 60000 UNIT/ML IV SOLN
3.0000 10*6.[IU] | INTRAVENOUS | Status: DC
  Administered 2016-06-13 (×2): 3 10*6.[IU] via INTRAVENOUS
  Filled 2016-06-13 (×4): qty 50

## 2016-06-13 MED ORDER — COCONUT OIL OIL
1.0000 "application " | TOPICAL_OIL | Status: DC | PRN
  Administered 2016-06-15: 1 via TOPICAL
  Filled 2016-06-13: qty 120

## 2016-06-13 MED ORDER — LIDOCAINE HCL (PF) 1 % IJ SOLN
INTRAMUSCULAR | Status: DC | PRN
  Administered 2016-06-13: 6 mL via EPIDURAL
  Administered 2016-06-13: 4 mL

## 2016-06-13 MED ORDER — ONDANSETRON HCL 4 MG/2ML IJ SOLN
4.0000 mg | Freq: Four times a day (QID) | INTRAMUSCULAR | Status: DC | PRN
  Administered 2016-06-13: 4 mg via INTRAVENOUS
  Filled 2016-06-13: qty 2

## 2016-06-13 MED ORDER — LACTATED RINGERS IV SOLN: 500.0000 mL | INTRAVENOUS | Status: DC | PRN

## 2016-06-13 MED ORDER — LACTATED RINGERS IV SOLN
500.0000 mL | Freq: Once | INTRAVENOUS | Status: AC
  Administered 2016-06-13: 500 mL via INTRAVENOUS

## 2016-06-13 MED ORDER — WITCH HAZEL-GLYCERIN EX PADS: 1.0000 "application " | MEDICATED_PAD | CUTANEOUS | Status: DC | PRN

## 2016-06-13 MED ORDER — OXYTOCIN 40 UNITS IN LACTATED RINGERS INFUSION - SIMPLE MED
2.5000 [IU]/h | INTRAVENOUS | Status: DC
  Filled 2016-06-13: qty 1000

## 2016-06-13 MED ORDER — SENNOSIDES-DOCUSATE SODIUM 8.6-50 MG PO TABS
2.0000 | ORAL_TABLET | ORAL | Status: DC
  Administered 2016-06-14 (×2): 2 via ORAL
  Filled 2016-06-13 (×2): qty 2

## 2016-06-13 MED ORDER — ZOLPIDEM TARTRATE 5 MG PO TABS: 5.0000 mg | ORAL_TABLET | Freq: Every evening | ORAL | Status: DC | PRN

## 2016-06-13 MED ORDER — ONDANSETRON HCL 4 MG PO TABS: 4.0000 mg | ORAL_TABLET | ORAL | Status: DC | PRN

## 2016-06-13 MED ORDER — OXYTOCIN BOLUS FROM INFUSION
500.0000 mL | Freq: Once | INTRAVENOUS | Status: AC
  Administered 2016-06-13: 500 mL via INTRAVENOUS

## 2016-06-13 NOTE — Anesthesia Procedure Notes (Signed)
Epidural Patient location during procedure: OB Start time: 06/13/2016 7:50 AM End time: 06/13/2016 8:02 AM  Preanesthetic Checklist Completed: patient identified, site marked, surgical consent, pre-op evaluation, timeout performed, IV checked, risks and benefits discussed and monitors and equipment checked  Epidural Patient position: sitting Prep: site prepped and draped and DuraPrep Patient monitoring: continuous pulse ox and blood pressure Approach: midline Location: L2-L3 Injection technique: LOR air  Needle:  Needle type: Tuohy  Needle gauge: 17 G Needle length: 9 cm and 9 Needle insertion depth: 5 cm cm Catheter type: closed end flexible Catheter size: 19 Gauge Catheter at skin depth: 10 cm Test dose: negative  Assessment Events: blood not aspirated, injection not painful, no injection resistance, negative IV test and no paresthesia  Additional Notes Likely has R---> L scoliosis  Dosing of Epidural:  1st dose, through catheter ............................................Marland Kitchen.  Xylocaine 40 mg  2nd dose, through catheter, after waiting 3 minutes........Marland Kitchen.Xylocaine 60 mg    As each dose occurred, patient was free of IV sx; and patient exhibited no evidence of SA injection.  Patient is more comfortable after epidural dosed. Please see RN's note for documentation of vital signs,and FHR which are stable.  Patient reminded not to try to ambulate with numb legs, and that an RN must be present when she attempts to get up.

## 2016-06-13 NOTE — Progress Notes (Signed)
Anesthesia called in regards to giving procardia with a running epidural. Per MD okay to give procardia while epidural running.

## 2016-06-13 NOTE — Anesthesia Preprocedure Evaluation (Signed)

## 2016-06-13 NOTE — Progress Notes (Signed)
Pt arrive to room 306 via wheelchair by Florinda MarkerMary Johnson, RN. Report taken and care resumed

## 2016-06-13 NOTE — Progress Notes (Signed)
**Note Barbara-Identified via Obfuscation** Patient ID: Barbara Mckinney, female   DOB: 07/05/1995, 20 y.o.   MRN: [id number]  S: Patient seen & examined for progress of labor. Patient comfortable with epidural. Discussed with patient possibility of going home if she does not make any further cervical change, as she may be having contractions but not in labor.    O:  Vitals:   06/13/16 1231 06/13/16 1301 06/13/16 1331 06/13/16 1401  BP: 106/72 (!) 103/56 121/78 123/86  Pulse: 89 98 90 92  Resp: 16 16 16 16   Temp:      TempSrc:      Weight:      Height:        Dilation: 4.5 Effacement (%): 90, 100 Cervical Position: Middle Station: -1 Presentation: Vertex Exam by:: Dr. Omer JackMumaw   FHT: 135 bpm, mod var, +accels, no decels TOCO: q4-10810min, irregular   A/P: Currently no cervical change, likely not in labor but is contracting frequently Will give 10mg  Procardia for contractions, turn off epidural, and have patient ambulate If no further change, will remove epidural and send home
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
## 2016-06-13 NOTE — Progress Notes (Signed)
At 1710 RN returned to room to check on patient and found patient in bathroom crying and very uncomfortable "trying to poop". RN got patient back to bed and checked cervix which was 9.5cm. MD notified to attend delivery. Monitors reapplied and patient delivered shortly after.

## 2016-06-13 NOTE — MAU Note (Signed)
Patient presents to MAU with complaints of contractions. Patient states contractions began on 06/12/16 and got stronger around 10pm. Denies bleeding or gush of fluid.

## 2016-06-13 NOTE — Progress Notes (Signed)
MD came ito room and checked pt. SVE still the same. MD told patient she could change into her clothes and prepare for discharge.

## 2016-06-14 LAB — RPR: RPR: NONREACTIVE

## 2016-06-14 MED ORDER — MENTHOL 3 MG MT LOZG
1.0000 | LOZENGE | OROMUCOSAL | Status: DC | PRN
  Administered 2016-06-14: 3 mg via ORAL
  Filled 2016-06-14: qty 9

## 2016-06-14 NOTE — Anesthesia Postprocedure Evaluation (Signed)
Anesthesia Post Note  Patient: Barbara Mckinney  Procedure(s) Performed: * No procedures listed *  Patient location during evaluation: Mother Baby Anesthesia Type: Spinal Level of consciousness: awake Pain management: satisfactory to patient Vital Signs Assessment: post-procedure vital signs reviewed and stable Respiratory status: spontaneous breathing Cardiovascular status: stable Anesthetic complications: no        Last Vitals:  Vitals:   06/14/16 0700 06/14/16 0800  BP:  117/83  Pulse:  72  Resp: 16 16  Temp:  36.7 C    Last Pain:  Vitals:   06/14/16 0840  TempSrc:   PainSc: 0-No pain   Pain Goal: Patients Stated Pain Goal: 4 (06/13/16 2012)               Cephus ShellingBURGER,Brent Taillon

## 2016-06-14 NOTE — Progress Notes (Signed)
Post Partum Day 1 Subjective: Patient is without complaints. She has ambulated and voided without difficulty. She denies chest pain, SOB, lightheadedness/dizziness  Objective: Blood pressure 114/80, pulse 66, temperature 98.5 F (36.9 C), temperature source Oral, resp. rate 14, height 5\' 2"  (1.575 m), weight 124 lb (56.2 kg), SpO2 98 %, unknown if currently breastfeeding.  Physical Exam:  General: alert, cooperative and no distress Lochia: appropriate Uterine Fundus: firm DVT Evaluation: No evidence of DVT seen on physical exam. No cords or calf tenderness.   Recent Labs  06/13/16 0529  HGB 12.7  HCT 36.3    Assessment/Plan: - Patient is doing well - Daughter is currently being formula fed while in NICU - She plans to use condoms for contraception - Discharge planning tomorrow  LOS: 1 day   Sharilyn Geisinger 06/14/2016, 6:31 AM

## 2016-06-15 MED ORDER — IBUPROFEN 600 MG PO TABS: 600.0000 mg | ORAL_TABLET | Freq: Four times a day (QID) | ORAL | 1 refills | Status: DC | PRN

## 2016-06-15 NOTE — Lactation Note (Signed)
This note was copied from a baby's chart. Lactation Consultation Note  Patient Name: Barbara Mckinney ZOXWR'UToday's Date: 06/15/2016 Reason for consult: Follow-up assessment   With this mom of a NICU baby, now 8842 hours old, and 36 3/7 weeks CGa, and SGA, weight 4 lbs 4.1 oz. Mom is being discharged to home today. She is pumping and getting drops of transitional milk. I decreased her to 21 flanges with a good fit. I gave mom a manual hand pump and instructed her in it;s use. [facility], and loaned mom a [facility] la[facility] for 12 days, due on 06/27/16. I also instructed mom in the use of the pump. Mom knows to call lactation for questions/soncerns, and to try and latch her baby in the [facility], even though she is small, and amy not transfer much.    Maternal Data    Feeding Feeding Type: Formula Nipple Type: Slow - flow Length of feed: 20 min  LATCH Score/Interventions                      Lactation Tools Discussed/Used     Consult Status Consult Status: Follow-up Follow-up type: In-patient (NICU)    Alfred LevinsLee, Lanessa Shill Anne 06/15/2016, 12:19 PM

## 2016-06-15 NOTE — Progress Notes (Signed)
Assumed care from Laurene FootmanKylie Wilson, an RN. Will continue to monitor.

## 2016-06-15 NOTE — Discharge Summary (Signed)
OB Discharge Summary  Patient Name: De BurrsManisha Mckinney DOB: 1996-01-04 MRN: [id number]  Date of admission: 06/13/2016 Delivering MD: Catalina AntiguaONSTANT, PEGGY   Date of discharge: 06/15/2016  Admitting diagnosis: 36 WEEKS CONTRACTIONS Intrauterine pregnancy: 5517w1d     Secondary diagnosis:Principal Problem:   Indication for care in labor or delivery  Additional problems:None     Discharge diagnosis: Preterm Pregnancy Delivered                                                                     Post partum procedures:None  Augmentation: None  Complications: None  Hospital course:  Onset of Labor With Vaginal Delivery     20 y.o. yo G1P0101 at 1817w1d was admitted in Latent Labor on 06/13/2016. Patient had an uncomplicated labor course as follows:  Membrane Rupture Time/Date: 5:24 PM ,06/13/2016   Intrapartum Procedures: Episiotomy: None [1]                                         Lacerations:  1st degree [2];Perineal [11]  Patient had a delivery of a Viable infant. 06/13/2016  Information for the patient's newborn:  Gwynn BurlyGurung, Girl Yun [[id number]][[id number]]  Delivery Method: Vaginal, Spontaneous Delivery (Filed from Delivery Summary)    Pateint had an uncomplicated postpartum course.  She is ambulating, tolerating a regular diet, passing flatus, and urinating well. Patient is discharged home in stable condition on 06/15/16.    Physical exam Vitals:   06/14/16 2300 06/14/16 2349 06/14/16 2355 06/15/16 0826  BP:  108/72  119/87  Pulse:  73  (!) 103  Resp: 14 16 16 17   Temp:  97.5 F (36.4 C)  97.9 F (36.6 C)  TempSrc:  Oral  Oral  SpO2:  100%  99%  Weight:      Height:       General: alert, cooperative and no distress Lochia: appropriate Uterine Fundus: firm, NT DVT Evaluation: Negative Homan's sign. Labs: Lab Results  Component Value Date   WBC 14.3 (H) 06/13/2016   HGB 12.7 06/13/2016   HCT 36.3 06/13/2016   MCV 87.9 06/13/2016   PLT 231 06/13/2016   CMP Latest Ref Rng &  Units 06/10/2016  Glucose 65 - 99 mg/dL 87  BUN 6 - 20 mg/dL 9  Creatinine 7.820.44 - 9.561.00 mg/dL 2.130.67  Sodium 086135 - 578145 mmol/L 135  Potassium 3.5 - 5.1 mmol/L 3.9  Chloride 101 - 111 mmol/L 108  CO2 22 - 32 mmol/L 18(L)  Calcium 8.9 - 10.3 mg/dL 8.9  Total Protein 6.5 - 8.1 g/dL 6.7  Total Bilirubin 0.3 - 1.2 mg/dL 4.6(N0.1(L)  Alkaline Phos 38 - 126 U/L 138(H)  AST 15 - 41 U/L 18  ALT 14 - 54 U/L 13(L)    Discharge instruction: per After Visit Summary and "Baby and Me Booklet".  After Visit Meds:  Allergies as of 06/15/2016      Reactions   Bacitracin Rash      Medication List    TAKE these medications   ibuprofen 600 MG tablet Commonly known as:  ADVIL,MOTRIN Take 1 tablet (600 mg total) by mouth every 6 (six) hours as  needed.   prenatal multivitamin Tabs tablet Take 1 tablet by mouth at bedtime.       Diet: routine diet  Activity: Advance as tolerated. Pelvic rest for 6 weeks.   Outpatient follow up:6 weeks Follow up Appt:No future appointments. Follow up visit: No Follow-up on file.  Postpartum contraception: Condoms  Newborn Data: Live born female  Birth Weight: 4 lb 4.1 oz (1930 g) APGAR: 9, 9  Baby Feeding: Bottle and Breast Disposition:NICU due to size and jaundice   06/15/2016 Reva Boresanya S Pratt, MD

## 2016-06-15 NOTE — Progress Notes (Signed)
Discharge instructions reviewed with patient. Verbalized an understanding of discharge instructions.  

## 2016-06-15 NOTE — Progress Notes (Signed)
CSW attempted to meet with MOB due to NICU admission and hx of GAD (2014), hx cutting (noted in 2014), hx of suicide attempt (2013), but she had already been discharged.  CSW will attempt to meet with MOB when she visits baby in NICU if possible.   CSW does not note any documentation of current mental health concerns. 

## 2016-06-15 NOTE — Discharge Instructions (Signed)

## 2019-01-16 ENCOUNTER — Other Ambulatory Visit: Payer: Self-pay

## 2019-01-16 DIAGNOSIS — Z20822 Contact with and (suspected) exposure to covid-19: Secondary | ICD-10-CM

## 2019-01-18 LAB — NOVEL CORONAVIRUS, NAA: SARS-CoV-2, NAA: NOT DETECTED

## 2019-05-01 ENCOUNTER — Other Ambulatory Visit: Payer: Self-pay

## 2019-05-01 DIAGNOSIS — Z20822 Contact with and (suspected) exposure to covid-19: Secondary | ICD-10-CM

## 2019-05-02 LAB — NOVEL CORONAVIRUS, NAA: SARS-CoV-2, NAA: NOT DETECTED

## 2019-07-16 ENCOUNTER — Ambulatory Visit: Payer: Medicaid Other | Attending: Internal Medicine

## 2019-07-16 DIAGNOSIS — Z20822 Contact with and (suspected) exposure to covid-19: Secondary | ICD-10-CM | POA: Diagnosis not present

## 2019-07-17 LAB — NOVEL CORONAVIRUS, NAA: SARS-CoV-2, NAA: DETECTED — AB

## 2020-03-09 ENCOUNTER — Encounter (HOSPITAL_COMMUNITY): Payer: Self-pay | Admitting: *Deleted

## 2020-03-09 ENCOUNTER — Emergency Department (HOSPITAL_COMMUNITY)
Admission: EM | Admit: 2020-03-09 | Discharge: 2020-03-10 | Disposition: A | Discharge status: Home / Self Care | Payer: Medicaid Other | Attending: Emergency Medicine | Admitting: Emergency Medicine

## 2020-03-09 ENCOUNTER — Other Ambulatory Visit: Payer: Self-pay

## 2020-03-09 DIAGNOSIS — R300 Dysuria: Secondary | ICD-10-CM | POA: Diagnosis present

## 2020-03-09 DIAGNOSIS — N3001 Acute cystitis with hematuria: Secondary | ICD-10-CM | POA: Diagnosis not present

## 2020-03-09 DIAGNOSIS — N3000 Acute cystitis without hematuria: Secondary | ICD-10-CM | POA: Diagnosis not present

## 2020-03-09 LAB — URINALYSIS, ROUTINE W REFLEX MICROSCOPIC
Bilirubin Urine: NEGATIVE
Glucose, UA: NEGATIVE mg/dL
Ketones, ur: NEGATIVE mg/dL
Nitrite: NEGATIVE
Protein, ur: 100 mg/dL — AB
Specific Gravity, Urine: 1.001 — ABNORMAL LOW (ref 1.005–1.030)
pH: 6 (ref 5.0–8.0)

## 2020-03-09 LAB — CBC
HCT: 39.4 % (ref 36.0–46.0)
Hemoglobin: 12.9 g/dL (ref 12.0–15.0)
MCH: 29.8 pg (ref 26.0–34.0)
MCHC: 32.7 g/dL (ref 30.0–36.0)
MCV: 91 fL (ref 80.0–100.0)
Platelets: 250 10*3/uL (ref 150–400)
RBC: 4.33 MIL/uL (ref 3.87–5.11)
RDW: 11.9 % (ref 11.5–15.5)
WBC: 11.4 10*3/uL — ABNORMAL HIGH (ref 4.0–10.5)
nRBC: 0 % (ref 0.0–0.2)

## 2020-03-09 LAB — COMPREHENSIVE METABOLIC PANEL
ALT: 17 U/L (ref 0–44)
AST: 22 U/L (ref 15–41)
Albumin: 4.2 g/dL (ref 3.5–5.0)
Alkaline Phosphatase: 79 U/L (ref 38–126)
Anion gap: 8 (ref 5–15)
BUN: 8 mg/dL (ref 6–20)
CO2: 26 mmol/L (ref 22–32)
Calcium: 9.3 mg/dL (ref 8.9–10.3)
Chloride: 103 mmol/L (ref 98–111)
Creatinine, Ser: 0.71 mg/dL (ref 0.44–1.00)
GFR calc Af Amer: >60 mL/min (ref >60)
GFR calc non Af Amer: >60 mL/min (ref >60)
Glucose, Bld: 100 mg/dL — ABNORMAL HIGH (ref 70–99)
Potassium: 4.6 mmol/L (ref 3.5–5.1)
Sodium: 137 mmol/L (ref 135–145)
Total Bilirubin: 0.5 mg/dL (ref 0.3–1.2)
Total Protein: 7.5 g/dL (ref 6.5–8.1)

## 2020-03-09 NOTE — ED Triage Notes (Signed)
The pt is c/o urinary frequency today with burning and stinging with some blood in urine  lmp 9-6

## 2020-03-10 LAB — I-STAT BETA HCG BLOOD, ED (MC, WL, AP ONLY): I-stat hCG, quantitative: <5 m[IU]/mL (ref <5)

## 2020-03-10 MED ORDER — CEPHALEXIN 500 MG PO CAPS: 500.0000 mg | ORAL_CAPSULE | Freq: Two times a day (BID) | ORAL | 0 refills | Status: AC

## 2020-03-10 NOTE — Discharge Instructions (Signed)
Take antibiotics as prescribed.  Take the entire course, even if your symptoms improve. Make sure stay well-hydrated water. Use Tylenol or ibuprofen as needed for pain. You may use Azo as needed for pain control. Follow-up with the [place] and wellness clinic listed below to set up primary care.  You may also call the 800 number in the paperwork to help set up primary care. Return to the emergency room if you develop persistent vomiting, severe worsening pain, inability urinate, or any new, worsening, or concerning symptoms.

## 2020-03-10 NOTE — ED Notes (Signed)
Patient verbalizes understanding of discharge instructions . Opportunity for questions and answers were provided . Armband removed by staff ,Pt discharged from ED. W/C  offered at D/C  and Declined W/C at D/C and was escorted to lobby by RN.  
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
## 2020-03-11 ENCOUNTER — Telehealth: Payer: Self-pay

## 2020-03-11 NOTE — Telephone Encounter (Signed)
Pt was dc'ed from ED on 03/09/2020 for acute cystitis with hematuria. Pt was rx'ed cephalexin 500 mg bid.   LVM for pt informing I will call back tomorrow to complete the TCM call.

## 2020-03-12 NOTE — Telephone Encounter (Signed)
Transition Care Management Follow-up Telephone Call  Date of discharge and from where: 03/09/2020  How have you been since you were released from the hospital? Patient is feeling much better.   Any questions or concerns? No  Items Reviewed:  Did the pt receive and understand the discharge instructions provided? No   Medications obtained and verified? Yes   Any new allergies since your discharge? No   Dietary orders reviewed? Yes  Do you have support at home? Yes   Functional Questionnaire: (I = Independent and D = Dependent) ADLs: I  Bathing/Dressing- I  Meal Prep- I  Eating- I  Maintaining continence- I  Transferring/Ambulation- I  Managing Meds- I  Follow up appointments reviewed:   PCP Hospital f/u appt confirmed? No   Are transportation arrangements needed? No   If their condition worsens, is the pt aware to call PCP or go to the Emergency Dept.? Yes  Was the patient provided with contact information for the PCP's office or ED? Yes  Was to pt encouraged to call back with questions or concerns? Yes

## 2020-03-17 ENCOUNTER — Ambulatory Visit: Payer: Medicaid Other

## 2020-03-24 ENCOUNTER — Ambulatory Visit (INDEPENDENT_AMBULATORY_CARE_PROVIDER_SITE_OTHER): Payer: Medicaid Other | Admitting: Nurse Practitioner

## 2020-03-24 VITALS — BP 102/68 | HR 100 | Temp 97.1°F | Wt 115.0 lb

## 2020-03-24 DIAGNOSIS — Z87440 Personal history of urinary (tract) infections: Secondary | ICD-10-CM | POA: Insufficient documentation

## 2020-03-24 DIAGNOSIS — R319 Hematuria, unspecified: Secondary | ICD-10-CM | POA: Insufficient documentation

## 2020-03-24 DIAGNOSIS — N912 Amenorrhea, unspecified: Secondary | ICD-10-CM | POA: Insufficient documentation

## 2020-03-24 DIAGNOSIS — R39198 Other difficulties with micturition: Secondary | ICD-10-CM | POA: Insufficient documentation

## 2020-03-24 HISTORY — DX: Personal history of urinary (tract) infections: Z87.440

## 2020-03-24 HISTORY — DX: Amenorrhea, unspecified: N91.2

## 2020-03-24 LAB — POCT URINALYSIS DIPSTICK
Bilirubin, UA: NEGATIVE
Blood, UA: NEGATIVE
Glucose, UA: NEGATIVE
Ketones, UA: NEGATIVE
Leukocytes, UA: NEGATIVE
Nitrite, UA: NEGATIVE
Protein, UA: NEGATIVE
Spec Grav, UA: <=1.005 — AB (ref 1.010–1.025)
Urobilinogen, UA: 0.2 E.U./dL
pH, UA: 6 (ref 5.0–8.0)

## 2020-03-24 LAB — POCT URINE PREGNANCY: Preg Test, Ur: NEGATIVE

## 2020-03-24 NOTE — Progress Notes (Signed)
@Patient  ID: , female    DOB: 1995/08/23, 24 y.o.   MRN: [id number]  Chief Complaint  Patient presents with  . Transitions Of Care    ED follow up, Patient stated blood in urine has cleared up, finished antibiotic rx    Referring provider: No ref. provider found  24 year old female with no significant health history.  HPI  Patient presents today for transition of care clinic visit.  Patient was seen in the ED on 03/09/2020 for severe lower abdominal pain and hematuria.  Patient was diagnosed with UTI and prescribed antibiotic.  Patient states that her symptoms have improved since ED discharge.  She states that she does have ongoing difficulty urinating.  She no longer has blood in her urine.  Her abdominal pain has subsided.  Patient states that she is try to stay well-hydrated.  Patient does not currently have a PCP.  We will set her up a visit to establish care with a new PCP during visit today.  Patient states that she does have ongoing issues with irregular periods since having a baby.  We will check a urine pregnancy test and a repeat UA in office today.  She can discuss irregular menstrual cycles with new PCP.  Denies f/c/s, n/v/d, hemoptysis, PND, chest pain or edema.       Allergies  Allergen Reactions  . Bacitracin Rash    There is no immunization history for the selected administration types on file for this patient.  History reviewed. No pertinent past medical history.  Tobacco History: Social History   Tobacco Use  Smoking Status Never Smoker  Smokeless Tobacco Never Used   Counseling given: Yes   Outpatient Encounter Medications as of 03/24/2020  Medication Sig  . ibuprofen (ADVIL,MOTRIN) 600 MG tablet Take 1 tablet (600 mg total) by mouth every 6 (six) hours as needed. (Patient not taking: Reported on 03/24/2020)  . [DISCONTINUED] Prenatal Vit-Fe Fumarate-FA (PRENATAL MULTIVITAMIN) TABS tablet Take 1 tablet by mouth at bedtime.  (Patient not  taking: Reported on 03/24/2020)   No facility-administered encounter medications on file as of 03/24/2020.     Review of Systems  Review of Systems  Constitutional: Negative.  Negative for fever.  HENT: Negative.   Cardiovascular: Negative.   Gastrointestinal: Negative.   Genitourinary: Positive for difficulty urinating and frequency.  Allergic/Immunologic: Negative.   Neurological: Negative.   Psychiatric/Behavioral: Negative.        Physical Exam  BP 102/68 (BP Location: Left Arm)   Pulse 100   Temp (!) 97.1 F (36.2 C)   Wt 115 lb (52.2 kg)   LMP 02/25/2020   SpO2 98%   Breastfeeding No   BMI 21.03 kg/m   Wt Readings from Last 5 Encounters:  03/24/20 115 lb (52.2 kg)  03/09/20 123 lb 14.4 oz (56.2 kg)  06/13/16 124 lb (56.2 kg)  12/19/15 96 lb 9.6 oz (43.8 kg)  11/27/15 101 lb 3 oz (45.9 kg)     Physical Exam Vitals and nursing note reviewed.  Constitutional:      General: She is not in acute distress.    Appearance: She is well-developed.  Cardiovascular:     Rate and Rhythm: Normal rate and regular rhythm.  Pulmonary:     Effort: Pulmonary effort is normal.     Breath sounds: Normal breath sounds.  Abdominal:     General: Bowel sounds are normal. There is no distension.     Tenderness: There is no abdominal tenderness. There is no  guarding.  Neurological:     Mental Status: She is alert and oriented to person, place, and time.        Assessment & Plan:   History of UTI Hematuria Difficulty urinating:  Glad you are improving!  Will set up appointment to establish care with a new PCP  Stay well hydrated  Concerned that with history of severe pain and hematuria this could have been a kidney stone - will place referral to urology    Irregular menstrual cycle:  Please follow up with new PCP   Follow up:  follow up as needed      Ivonne Andrew, NP 03/24/2020

## 2020-03-24 NOTE — Patient Instructions (Addendum)
UTI Hematuria Difficulty urinating:  Glad you are improving!  Will set up appointment to establish care with a new PCP  Stay well hydrated  Concerned that with history of severe pain and hematuria this could have been a kidney stone - will place referral to urology    Irregular menstrual cycle:  Please follow up with new PCP   Follow up:  follow up as needed

## 2020-03-24 NOTE — Assessment & Plan Note (Signed)
Hematuria Difficulty urinating:  Glad you are improving!  Will set up appointment to establish care with a new PCP  Stay well hydrated  Concerned that with history of severe pain and hematuria this could have been a kidney stone - will place referral to urology    Irregular menstrual cycle:  Please follow up with new PCP   Follow up:  follow up as needed

## 2020-04-07 ENCOUNTER — Ambulatory Visit (INDEPENDENT_AMBULATORY_CARE_PROVIDER_SITE_OTHER): Payer: Medicaid Other | Admitting: Nurse Practitioner

## 2020-04-07 ENCOUNTER — Ambulatory Visit: Payer: Medicaid Other | Admitting: Nurse Practitioner

## 2020-04-07 ENCOUNTER — Other Ambulatory Visit: Payer: Self-pay

## 2020-04-07 VITALS — BP 116/78 | HR 78 | Temp 97.8°F | Resp 16 | Ht 63.0 in | Wt 115.0 lb

## 2020-04-07 DIAGNOSIS — Z1329 Encounter for screening for other suspected endocrine disorder: Secondary | ICD-10-CM | POA: Diagnosis not present

## 2020-04-07 DIAGNOSIS — N926 Irregular menstruation, unspecified: Secondary | ICD-10-CM

## 2020-04-07 NOTE — Patient Instructions (Signed)
Abnormal Uterine Bleeding °Abnormal uterine bleeding means bleeding more than usual from your uterus. It can include: °· Bleeding between periods. °· Bleeding after sex. °· Bleeding that is heavier than normal. °· Periods that last longer than usual. °· Bleeding after you have stopped having your period (menopause). °There are many problems that may cause this. You should see a doctor for any kind of bleeding that is not normal. Treatment depends on the cause of the bleeding. °Follow these instructions at home: °· Watch your condition for any changes. °· Do not use tampons, douche, or have sex, if your doctor tells you not to. °· Change your pads often. °· Get regular well-woman exams. Make sure they include a pelvic exam and cervical cancer screening. °· Keep all follow-up visits as told by your doctor. This is important. °Contact a doctor if: °· The bleeding lasts more than one week. °· You feel dizzy at times. °· You feel like you are going to throw up (nauseous). °· You throw up. °Get help right away if: °· You pass out. °· You have to change pads every hour. °· You have belly (abdominal) pain. °· You have a fever. °· You get sweaty. °· You get weak. °· You passing large blood clots from your vagina. °Summary °· Abnormal uterine bleeding means bleeding more than usual from your uterus. °· There are many problems that may cause this. You should see a doctor for any kind of bleeding that is not normal. °· Treatment depends on the cause of the bleeding. °This information is not intended to replace advice given to you by your health care provider. Make sure you discuss any questions you have with your health care provider. °Document Revised: 06/01/2016 Document Reviewed: 06/01/2016 °Elsevier Patient Education © 2020 [employer] ° °

## 2020-04-07 NOTE — Progress Notes (Signed)
[facility] Patient [facility] [address] [place], [place]  [address] Phone:  [phone]   Fax:  [phone]   New Patient Office Visit  Subjective:  Patient ID: Barbara Mckinney, female    DOB: 1995-06-25  Age: 24 y.o. MRN: [id number]  CC:  Chief Complaint  Patient presents with  . Menstrual Problem    HPI Barbara Mckinney presents for to establish care  Irregular Menstruation Patient complains of irregular menses. No LMP recorded. (Menstrual status: Irregular Periods). Periods are irregular, lasting 4 days. Dysmenorrhea:mild, occurring throughout menses. Cyclic symptoms include: none. Current contraception: none. History of infertility: no. History of abnormal Pap smear: no. She admits that her last cycle was several months ago.   No past medical history on file.  Past Surgical History:  Procedure Laterality Date  . LAPAROSCOPY N/A 01/07/2013   Procedure: LAPAROSCOPY DIAGNOSTIC;  Surgeon: Robyne Askew, MD;  Location: [facility] OR;  Service: General;  Laterality: N/A;  . LAPAROTOMY N/A 01/07/2013   Procedure: EXPLORATORY LAPAROTOMY with repair of through and through gastric injury;  Surgeon: Robyne Askew, MD;  Location: [facility] OR;  Service: General;  Laterality: N/A;    No family history on file.  Social History   Socioeconomic History  . Marital status: Single    Spouse name: Not on file  . Number of children: 1  . Years of education: Not on file  . Highest education level: Not on file  Occupational History  . Not on file  Tobacco Use  . Smoking status: Never Smoker  . Smokeless tobacco: Never Used  Substance and Sexual Activity  . Alcohol use: Yes    Comment: occ  . Drug use: No  . Sexual activity: Never  Other Topics Concern  . Not on file  Social History Narrative  . Not on file   Social Determinants of Health   Financial Resource Strain:   . Difficulty of Paying Living Expenses: Not on file  Food Insecurity:   . Worried About Programme researcher, broadcasting/film/video in  the Last Year: Not on file  . Ran Out of Food in the Last Year: Not on file  Transportation Needs:   . Lack of Transportation (Medical): Not on file  . Lack of Transportation (Non-Medical): Not on file  Physical Activity:   . Days of Exercise per Week: Not on file  . Minutes of Exercise per Session: Not on file  Stress:   . Feeling of Stress : Not on file  Social Connections:   . Frequency of Communication with Friends and Family: Not on file  . Frequency of Social Gatherings with Friends and Family: Not on file  . Attends Religious Services: Not on file  . Active Member of Clubs or Organizations: Not on file  . Attends Banker Meetings: Not on file  . Marital Status: Not on file  Intimate Partner Violence:   . Fear of Current or Ex-Partner: Not on file  . Emotionally Abused: Not on file  . Physically Abused: Not on file  . Sexually Abused: Not on file    ROS Review of Systems  Neurological: Negative for dizziness and headaches (Occasional headaches will keep a diary).    Objective:   Today's Vitals: BP 116/78 (BP Location: Right Arm, Patient Position: Sitting)   Pulse 78   Temp 97.8 F (36.6 C)   Resp 16   Ht 5\' 3"  (1.6 m)   Wt 115 lb (52.2 kg)  SpO2 100%   BMI 20.37 kg/m   Physical Exam Constitutional:      General: She is not in acute distress.    Appearance: She is normal weight. She is not ill-appearing, toxic-appearing or diaphoretic.  HENT:     Head: Normocephalic and atraumatic.     Nose: Nose normal.     Mouth/Throat:     Mouth: Mucous membranes are moist.  Cardiovascular:     Rate and Rhythm: Normal rate and regular rhythm.     Pulses: Normal pulses.     Heart sounds: Normal heart sounds.  Pulmonary:     Effort: Pulmonary effort is normal.     Breath sounds: Normal breath sounds.  Abdominal:     General: Abdomen is flat. Bowel sounds are normal.     Palpations: Abdomen is soft.  Musculoskeletal:        General: Normal range of  motion.     Cervical back: Normal range of motion.  Skin:    General: Skin is warm and dry.     Capillary Refill: Capillary refill takes less than 2 seconds.  Neurological:     General: No focal deficit present.     Mental Status: She is alert and oriented to person, place, and time.  Psychiatric:        Mood and Affect: Mood normal.        Behavior: Behavior normal.        Thought Content: Thought content normal.        Judgment: Judgment normal.     Assessment & Plan:   Problem List Items Addressed This Visit    None    Visit Diagnoses    Irregular menstrual bleeding    -  Primary Labs pending we will continue to evaluate We will update Pap test   Relevant Orders   Comp. Metabolic Panel (12) (Completed)   Hormone Panel (Completed)   CBC with Differential/Platelet (Completed)   NuSwab Vaginitis Plus (VG+) (Completed)   Screening for hypothyroidism       Relevant Orders   Comp. Metabolic Panel (12) (Completed)   Hormone Panel (Completed)      Outpatient Encounter Medications as of 04/07/2020  Medication Sig  . ibuprofen (ADVIL,MOTRIN) 600 MG tablet Take 1 tablet (600 mg total) by mouth every 6 (six) hours as needed. (Patient not taking: Reported on 03/24/2020)   No facility-administered encounter medications on file as of 04/07/2020.    Follow-up: Return in about 4 weeks (around 05/05/2020) for well woman physcial.   [name], NP

## 2020-04-08 LAB — HORMONE PANEL (T4,TSH,FSH,TESTT,SHBG,DHEA,ETC)

## 2020-04-08 LAB — COMP. METABOLIC PANEL (12)
AST: 19 IU/L (ref 0–40)
Bilirubin Total: 0.7 mg/dL (ref 0.0–1.2)
Potassium: 4.6 mmol/L (ref 3.5–5.2)
Sodium: 136 mmol/L (ref 134–144)

## 2020-04-08 LAB — CBC WITH DIFFERENTIAL/PLATELET
Hemoglobin: 13.9 g/dL (ref 11.1–15.9)
MCHC: 33.6 g/dL (ref 31.5–35.7)
MCV: 91 fL (ref 79–97)
Neutrophils Absolute: 6.1 10*3/uL (ref 1.4–7.0)
RBC: 4.54 x10E6/uL (ref 3.77–5.28)

## 2020-04-10 LAB — NUSWAB VAGINITIS PLUS (VG+)
Candida albicans, NAA: NEGATIVE
Candida glabrata, NAA: POSITIVE — AB
Chlamydia trachomatis, NAA: NEGATIVE
Neisseria gonorrhoeae, NAA: NEGATIVE
Trich vag by NAA: NEGATIVE

## 2020-04-11 ENCOUNTER — Other Ambulatory Visit: Payer: Self-pay | Admitting: Nurse Practitioner

## 2020-04-11 ENCOUNTER — Encounter: Payer: Self-pay | Admitting: Nurse Practitioner

## 2020-04-11 LAB — CBC WITH DIFFERENTIAL/PLATELET
Basophils Absolute: 0 10*3/uL (ref 0.0–0.2)
Monocytes: 6 %
WBC: 8.7 10*3/uL (ref 3.4–10.8)

## 2020-04-11 LAB — COMP. METABOLIC PANEL (12)
Alkaline Phosphatase: 98 IU/L (ref 44–121)
Creatinine, Ser: 0.69 mg/dL (ref 0.57–1.00)
Total Protein: 7.7 g/dL (ref 6.0–8.5)

## 2020-04-11 MED ORDER — FLUCONAZOLE 150 MG PO TABS: 150.0000 mg | ORAL_TABLET | Freq: Once | ORAL | 0 refills | Status: AC

## 2020-04-16 LAB — COMP. METABOLIC PANEL (12)
Albumin/Globulin Ratio: 1.8 (ref 1.2–2.2)
Albumin: 4.9 g/dL (ref 3.9–5.0)
BUN/Creatinine Ratio: 16 (ref 9–23)
BUN: 11 mg/dL (ref 6–20)
Calcium: 9.3 mg/dL (ref 8.7–10.2)
Chloride: 99 mmol/L (ref 96–106)
GFR calc Af Amer: 141 mL/min/{1.73_m2} (ref >59)
GFR calc non Af Amer: 122 mL/min/{1.73_m2} (ref >59)
Globulin, Total: 2.8 g/dL (ref 1.5–4.5)
Glucose: 94 mg/dL (ref 65–99)

## 2020-04-16 LAB — CBC WITH DIFFERENTIAL/PLATELET
Basos: 0 %
EOS (ABSOLUTE): 0.2 10*3/uL (ref 0.0–0.4)
Eos: 3 %
Hematocrit: 41.4 % (ref 34.0–46.6)
Immature Grans (Abs): 0 10*3/uL (ref 0.0–0.1)
Immature Granulocytes: 0 %
Lymphocytes Absolute: 1.8 10*3/uL (ref 0.7–3.1)
Lymphs: 21 %
MCH: 30.6 pg (ref 26.6–33.0)
Monocytes Absolute: 0.5 10*3/uL (ref 0.1–0.9)
Neutrophils: 70 %
Platelets: 243 10*3/uL (ref 150–450)
RDW: 11.8 % (ref 11.7–15.4)

## 2020-04-16 LAB — HORMONE PANEL (T4,TSH,FSH,TESTT,SHBG,DHEA,ETC)
DHEA-Sulfate, LCMS: 110 ug/dL
Estradiol, Serum, MS: 181 pg/mL
Estrone Sulfate: 440 ng/dL — ABNORMAL HIGH
Follicle Stimulating Hormone: 2.7 m[IU]/mL
Free T-3: 3.2 pg/mL
Free Testosterone, Serum: 1.2 pg/mL
Progesterone, Serum: 968 ng/dL
Sex Hormone Binding Globulin: 83.5 nmol/L
TSH: 0.84 uU/mL
Testosterone, Serum (Total): 31 ng/dL

## 2020-05-03 ENCOUNTER — Encounter (HOSPITAL_COMMUNITY): Payer: Self-pay | Admitting: Emergency Medicine

## 2020-05-03 ENCOUNTER — Other Ambulatory Visit: Payer: Self-pay

## 2020-05-03 ENCOUNTER — Emergency Department (HOSPITAL_COMMUNITY): Payer: Medicaid Other

## 2020-05-03 ENCOUNTER — Emergency Department (HOSPITAL_COMMUNITY)
Admission: EM | Admit: 2020-05-03 | Discharge: 2020-05-03 | Disposition: A | Discharge status: Home / Self Care | Payer: Medicaid Other | Attending: Emergency Medicine | Admitting: Emergency Medicine

## 2020-05-03 DIAGNOSIS — J189 Pneumonia, unspecified organism: Secondary | ICD-10-CM | POA: Insufficient documentation

## 2020-05-03 DIAGNOSIS — Z20822 Contact with and (suspected) exposure to covid-19: Secondary | ICD-10-CM | POA: Insufficient documentation

## 2020-05-03 DIAGNOSIS — R059 Cough, unspecified: Secondary | ICD-10-CM | POA: Diagnosis not present

## 2020-05-03 LAB — RESPIRATORY PANEL BY RT PCR (FLU A&B, COVID)
Influenza A by PCR: NEGATIVE
Influenza B by PCR: NEGATIVE
SARS Coronavirus 2 by RT PCR: NEGATIVE

## 2020-05-03 LAB — POC URINE PREG, ED: Preg Test, Ur: NEGATIVE

## 2020-05-03 MED ORDER — DOXYCYCLINE HYCLATE 100 MG PO TABS
100.0000 mg | ORAL_TABLET | Freq: Once | ORAL | Status: AC
  Administered 2020-05-03: 100 mg via ORAL
  Filled 2020-05-03: qty 1

## 2020-05-03 MED ORDER — DOXYCYCLINE HYCLATE 100 MG PO CAPS: 100.0000 mg | ORAL_CAPSULE | Freq: Two times a day (BID) | ORAL | 0 refills | Status: AC
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
## 2020-05-03 NOTE — ED Triage Notes (Signed)
Patient reports productive cough with nasal congestion this week , denies fever or chills , respirations unlabored , fully vaccinated against Covid19.

## 2020-05-05 ENCOUNTER — Ambulatory Visit: Payer: Medicaid Other | Admitting: Nurse Practitioner

## 2020-05-06 ENCOUNTER — Ambulatory Visit: Payer: Medicaid Other | Admitting: Urology

## 2020-06-02 ENCOUNTER — Ambulatory Visit (INDEPENDENT_AMBULATORY_CARE_PROVIDER_SITE_OTHER): Payer: Medicaid Other | Admitting: Nurse Practitioner

## 2020-06-02 ENCOUNTER — Other Ambulatory Visit: Payer: Self-pay

## 2020-06-02 ENCOUNTER — Encounter: Payer: Self-pay | Admitting: Nurse Practitioner

## 2020-06-02 VITALS — BP 117/79 | HR 92 | Temp 97.9°F | Resp 16 | Ht 63.0 in | Wt 116.6 lb

## 2020-06-02 DIAGNOSIS — Z118 Encounter for screening for other infectious and parasitic diseases: Secondary | ICD-10-CM

## 2020-06-02 DIAGNOSIS — Z01419 Encounter for gynecological examination (general) (routine) without abnormal findings: Secondary | ICD-10-CM | POA: Diagnosis not present

## 2020-06-02 DIAGNOSIS — Z124 Encounter for screening for malignant neoplasm of cervix: Secondary | ICD-10-CM

## 2020-06-02 NOTE — Patient Instructions (Signed)

## 2020-06-02 NOTE — Progress Notes (Signed)
[facility] Patient [facility] [address] [place], [place]  [address] Phone:  [phone]   Fax:  [phone]   Established Patient Office Visit  Subjective:  Patient ID: Barbara Mckinney, female    DOB: 1996-05-05  Age: 24 y.o. MRN: [id number]  CC:  Chief Complaint  Patient presents with  . Follow-up    HPI Barbara Mckinney presents for follow up. She  has no past medical history on file.   She is here today for wellness visit.  History reviewed. No pertinent past medical history.  Past Surgical History:  Procedure Laterality Date  . LAPAROSCOPY N/A 01/07/2013   Procedure: LAPAROSCOPY DIAGNOSTIC;  Surgeon: Robyne Askew, MD;  Location: [facility] OR;  Service: General;  Laterality: N/A;  . LAPAROTOMY N/A 01/07/2013   Procedure: EXPLORATORY LAPAROTOMY with repair of through and through gastric injury;  Surgeon: Robyne Askew, MD;  Location: [facility] OR;  Service: General;  Laterality: N/A;    History reviewed. No pertinent family history.  Social History   Socioeconomic History  . Marital status: Single    Spouse name: Not on file  . Number of children: 1  . Years of education: Not on file  . Highest education level: Not on file  Occupational History  . Not on file  Tobacco Use  . Smoking status: Never Smoker  . Smokeless tobacco: Never Used  Substance and Sexual Activity  . Alcohol use: Yes    Comment: occ  . Drug use: No  . Sexual activity: Never  Other Topics Concern  . Not on file  Social History Narrative  . Not on file   Social Determinants of Health   Financial Resource Strain: Not on file  Food Insecurity: Not on file  Transportation Needs: Not on file  Physical Activity: Not on file  Stress: Not on file  Social Connections: Not on file  Intimate Partner Violence: Not on file    Outpatient Medications Prior to Visit  Medication Sig Dispense Refill  . ibuprofen (ADVIL,MOTRIN) 600 MG tablet Take 1 tablet (600 mg total) by mouth every 6 (six) hours as  needed. (Patient not taking: No sig reported) 30 tablet 1   No facility-administered medications prior to visit.    Allergies  Allergen Reactions  . Bacitracin Rash    ROS Review of Systems    Objective:    Physical Exam Exam conducted with a chaperone present.  Constitutional:      General: She is not in acute distress.    Appearance: She is normal weight. She is not ill-appearing, toxic-appearing or diaphoretic.  HENT:     Head: Normocephalic and atraumatic.  Cardiovascular:     Rate and Rhythm: Normal rate.     Pulses: Normal pulses.  Pulmonary:     Effort: Pulmonary effort is normal.  Chest:  Breasts:     Tanner Score is 5.     Right: Normal.     Left: Normal.    Abdominal:     Palpations: Abdomen is soft.  Genitourinary:    General: Normal vulva.     Vagina: No vaginal discharge.     Rectum: Normal. Guaiac result negative.  Musculoskeletal:        General: Normal range of motion.     Cervical back: Normal range of motion.  Skin:    General: Skin is warm and dry.     Capillary Refill: Capillary refill takes less than 2 seconds.  Neurological:  General: No focal deficit present.     Mental Status: She is alert and oriented to person, place, and time.  Psychiatric:        Mood and Affect: Mood normal.        Behavior: Behavior normal.        Thought Content: Thought content normal.        Judgment: Judgment normal.     BP 117/79 (BP Location: Left Arm, Patient Position: Sitting, Cuff Size: Normal)   Pulse 92   Temp 97.9 F (36.6 C) (Temporal)   Resp 16   Ht 5\' 3"  (1.6 m)   Wt 116 lb 9.6 oz (52.9 kg)   SpO2 100%   BMI 20.65 kg/m  Wt Readings from Last 3 Encounters:  06/02/20 116 lb 9.6 oz (52.9 kg)  05/03/20 125 lb 10.6 oz (57 kg)  04/07/20 115 lb (52.2 kg)     There are no preventive care reminders to display for this patient.  There are no preventive care reminders to display for this patient.  No results found for: TSH Lab Results   Component Value Date   WBC 8.7 04/07/2020   HGB 13.9 04/07/2020   HCT 41.4 04/07/2020   MCV 91 04/07/2020   PLT 243 04/07/2020   Lab Results  Component Value Date   NA 136 04/07/2020   K 4.6 04/07/2020   CO2 26 03/09/2020   GLUCOSE 94 04/07/2020   BUN 11 04/07/2020   CREATININE 0.69 04/07/2020   BILITOT 0.7 04/07/2020   ALKPHOS 98 04/07/2020   AST 19 04/07/2020   ALT 17 03/09/2020   PROT 7.7 04/07/2020   ALBUMIN 4.9 04/07/2020   CALCIUM 9.3 04/07/2020   ANIONGAP 8 03/09/2020   No results found for: CHOL No results found for: HDL No results found for: LDLCALC No results found for: TRIG No results found for: CHOLHDL No results found for: 03/11/2020    Assessment & Plan:   Problem List Items Addressed This Visit   None   Visit Diagnoses    Well woman exam with routine gynecological exam    -  Primary Female health maintenance discussed with monthly breast exams with demonstration Encourage monthly monitoring and notify office of any changes   Screening for chlamydial disease       Relevant Orders   GC/Chlamydia Probe Amp ([facility]) (Completed)   NuSwab Vaginitis Plus (VG+) (Completed)   Cervical cancer screening       Relevant Orders   IGP, rfx Aptima HPV ASCU ([facility]) (Completed)   NuSwab Vaginitis Plus (VG+) (Completed)      No orders of the defined types were placed in this encounter.   Follow-up: Return in about 6 months (around 12/01/2020).    12/03/2020, NP

## 2020-06-05 LAB — IGP, RFX APTIMA HPV ASCU

## 2020-06-05 LAB — SPECIMEN STATUS REPORT

## 2020-06-06 LAB — GC/CHLAMYDIA PROBE AMP

## 2020-06-07 LAB — NUSWAB VAGINITIS PLUS (VG+)
Candida albicans, NAA: NEGATIVE
Candida glabrata, NAA: POSITIVE — AB
Chlamydia trachomatis, NAA: NEGATIVE
Neisseria gonorrhoeae, NAA: NEGATIVE
Trich vag by NAA: NEGATIVE

## 2020-06-09 ENCOUNTER — Other Ambulatory Visit: Payer: Self-pay | Admitting: Nurse Practitioner

## 2020-06-09 DIAGNOSIS — Z3200 Encounter for pregnancy test, result unknown: Secondary | ICD-10-CM

## 2020-06-09 MED ORDER — FLUCONAZOLE 150 MG PO TABS: 150.0000 mg | ORAL_TABLET | Freq: Once | ORAL | 0 refills | Status: AC

## 2020-06-09 NOTE — Progress Notes (Signed)
   [facility] Patient [facility] [address] [place], [place]  [address] Phone:  [phone]   Fax:  [phone]  Pt has completed a home pregnancy test and would like to have this confirmed.

## 2020-06-11 ENCOUNTER — Other Ambulatory Visit (INDEPENDENT_AMBULATORY_CARE_PROVIDER_SITE_OTHER): Payer: Medicaid Other

## 2020-06-11 ENCOUNTER — Other Ambulatory Visit: Payer: Self-pay | Admitting: Nurse Practitioner

## 2020-06-11 ENCOUNTER — Other Ambulatory Visit: Payer: Self-pay

## 2020-06-11 DIAGNOSIS — Z3200 Encounter for pregnancy test, result unknown: Secondary | ICD-10-CM | POA: Diagnosis not present

## 2020-06-11 LAB — POCT URINE PREGNANCY: Preg Test, Ur: POSITIVE — AB

## 2020-06-12 LAB — BETA HCG QUANT (REF LAB): hCG Quant: 8883 m[IU]/mL

## 2020-06-16 ENCOUNTER — Ambulatory Visit: Payer: Medicaid Other | Admitting: Urology

## 2020-06-21 NOTE — L&D Delivery Note (Signed)
OB/GYN Faculty Practice Delivery Note  Barbara Mckinney is a 25 y.o. [id number] s/p NSVD at [redacted]w[redacted]d. She was admitted for labor.   ROM: 0h 25m with clear fluid GBS Status: Negative Maximum Maternal Temperature: 97.9  Labor Progress: Presented in active labor, AROMed, progressed to fully and delivered.  Delivery Date/Time: 9747 on 02/05/2021 Delivery: Called to room and patient was complete and pushing. Head delivered spontaneous. Nuchal cord was present but loose so baby was delivered through it. Shoulder and body delivered in usual fashion. Infant with spontaneous cry, placed on mother's abdomen, dried and stimulated. Cord clamped x 2 after 1-minute delay, and cut by father of baby. Cord blood drawn. Placenta delivered spontaneously with gentle cord traction. Fundus firm with massage and Pitocin. Labia, perineum, vagina, and cervix inspected and there was a small right periurethral and non parallel left periurethral laceration and hemostatic and therefore not repaired.  Placenta: delivered intact, 3 V cord, sent to L&D Complications: None Lacerations: periurethral - hemostatic and not repaired EBL: 200cc Analgesia: epidural  Infant: female  APGARs 9,9  2940g  Warner Mccreedy, MD, MPH OB Fellow, Faculty Practice Center for [facility], [facility] 02/05/2021, 11:33 AM

## 2020-06-26 ENCOUNTER — Encounter (HOSPITAL_COMMUNITY): Payer: Self-pay | Admitting: Family Medicine

## 2020-06-26 ENCOUNTER — Inpatient Hospital Stay (HOSPITAL_COMMUNITY)
Admission: AD | Admit: 2020-06-26 | Discharge: 2020-06-26 | Disposition: A | Discharge status: Home / Self Care | Payer: Medicaid Other | Attending: Family Medicine | Admitting: Family Medicine

## 2020-06-26 ENCOUNTER — Inpatient Hospital Stay (HOSPITAL_COMMUNITY): Payer: Medicaid Other

## 2020-06-26 ENCOUNTER — Other Ambulatory Visit: Payer: Self-pay

## 2020-06-26 DIAGNOSIS — R109 Unspecified abdominal pain: Secondary | ICD-10-CM | POA: Diagnosis not present

## 2020-06-26 DIAGNOSIS — Z20822 Contact with and (suspected) exposure to covid-19: Secondary | ICD-10-CM | POA: Diagnosis not present

## 2020-06-26 DIAGNOSIS — R519 Headache, unspecified: Secondary | ICD-10-CM | POA: Diagnosis not present

## 2020-06-26 DIAGNOSIS — Z3A01 Less than 8 weeks gestation of pregnancy: Secondary | ICD-10-CM | POA: Diagnosis not present

## 2020-06-26 DIAGNOSIS — R102 Pelvic and perineal pain: Secondary | ICD-10-CM | POA: Insufficient documentation

## 2020-06-26 DIAGNOSIS — Z3490 Encounter for supervision of normal pregnancy, unspecified, unspecified trimester: Secondary | ICD-10-CM

## 2020-06-26 DIAGNOSIS — R42 Dizziness and giddiness: Secondary | ICD-10-CM | POA: Diagnosis not present

## 2020-06-26 DIAGNOSIS — O3411 Maternal care for benign tumor of corpus uteri, first trimester: Secondary | ICD-10-CM | POA: Diagnosis not present

## 2020-06-26 DIAGNOSIS — O210 Mild hyperemesis gravidarum: Secondary | ICD-10-CM

## 2020-06-26 DIAGNOSIS — O26891 Other specified pregnancy related conditions, first trimester: Secondary | ICD-10-CM | POA: Insufficient documentation

## 2020-06-26 DIAGNOSIS — Z3A10 10 weeks gestation of pregnancy: Secondary | ICD-10-CM | POA: Diagnosis not present

## 2020-06-26 DIAGNOSIS — O219 Vomiting of pregnancy, unspecified: Secondary | ICD-10-CM | POA: Diagnosis not present

## 2020-06-26 DIAGNOSIS — O26899 Other specified pregnancy related conditions, unspecified trimester: Secondary | ICD-10-CM

## 2020-06-26 LAB — URINALYSIS, ROUTINE W REFLEX MICROSCOPIC
Bacteria, UA: NONE SEEN
Bilirubin Urine: NEGATIVE
Glucose, UA: NEGATIVE mg/dL
Hgb urine dipstick: NEGATIVE
Ketones, ur: NEGATIVE mg/dL
Leukocytes,Ua: NEGATIVE
Nitrite: NEGATIVE
Protein, ur: 30 mg/dL — AB
Specific Gravity, Urine: 1.034 — ABNORMAL HIGH (ref 1.005–1.030)
pH: 5 (ref 5.0–8.0)

## 2020-06-26 MED ORDER — METOCLOPRAMIDE HCL 10 MG PO TABS: 10.0000 mg | ORAL_TABLET | Freq: Three times a day (TID) | ORAL | 1 refills | Status: DC

## 2020-06-26 MED ORDER — FAMOTIDINE 20 MG PO TABS: 20.0000 mg | ORAL_TABLET | Freq: Every day | ORAL | 1 refills | Status: DC

## 2020-06-26 NOTE — Discharge Instructions (Signed)
Safe Medications in Pregnancy    Acne: Benzoyl Peroxide Salicylic Acid  Backache/Headache: Tylenol: 2 regular strength every 4 hours OR              2 Extra strength every 6 hours  Colds/Coughs/Allergies: Benadryl (alcohol free) 25 mg every 6 hours as needed Breath right strips Claritin Cepacol throat lozenges Chloraseptic throat spray Cold-Eeze- up to three times per day Cough drops, alcohol free Flonase (by prescription only) Guaifenesin Mucinex Robitussin DM (plain only, alcohol free) Saline nasal spray/drops Sudafed (pseudoephedrine) & Actifed ** use only after [redacted] weeks gestation and if you do not have high blood pressure Tylenol Vicks Vaporub Zinc lozenges Zyrtec   Constipation: Colace Ducolax suppositories Fleet enema Glycerin suppositories Metamucil Milk of magnesia Miralax Senokot Smooth move tea  Diarrhea: Kaopectate Imodium A-D  *NO pepto Bismol  Hemorrhoids: Anusol Anusol HC Preparation H Tucks  Indigestion: Tums Maalox Mylanta Zantac  Pepcid  Insomnia: Benadryl (alcohol free) 25mg  every 6 hours as needed Tylenol PM Unisom, no Gelcaps  Leg Cramps: Tums MagGel  Nausea/Vomiting:  Bonine Dramamine Emetrol Ginger extract Sea bands Meclizine  Nausea medication to take during pregnancy:  Unisom (doxylamine succinate 25 mg tablets) Take one tablet daily at bedtime. If symptoms are not adequately controlled, the dose can be increased to a maximum recommended dose of two tablets daily (1/2 tablet in the morning, 1/2 tablet mid-afternoon and one at bedtime). Vitamin B6 100mg  tablets. Take one tablet twice a day (up to 200 mg per day).  Skin Rashes: Aveeno products Benadryl cream or 25mg  every 6 hours as needed Calamine Lotion 1% cortisone cream  Yeast infection: Gyne-lotrimin 7 Monistat 7   **If taking multiple medications, please check labels to avoid duplicating the same active ingredients **take  medication as directed on the label ** Do not exceed 4000 mg of tylenol in 24 hours **Do not take medications that contain aspirin or ibuprofen          Prenatal Care Providers           [facility] @ [facility] - accepts patients without insurance  Phone: [phone]  [facility] @ [facility]   Phone: [phone]  [facility] @[place]       Phone: [phone]            [facility] @ [facility]     Phone: [phone]          [facility] @ [facility]   Phone: [phone]  [facility] @ [facility] - accepts patients without insurance  Phone: [phone]  [facility] @ [facility] Phone: [phone]     [facility] - accepts patients without insurance Phone: [phone]  [facility]  Phone: [phone]  [facility] Phone: [phone]  [facility] Phone: [phone]  [facility] Phone: [phone]  [facility] Phone: [phone]  [facility]  Phone: [phone]          Abdominal Pain During Pregnancy  Abdominal pain is common during pregnancy, and has many possible causes. Some causes are more serious than others, and sometimes the cause is not known. Abdominal pain can be a sign that labor is starting. It can also be caused by normal growth and stretching of muscles and ligaments during pregnancy. Always tell your health care provider if you have any abdominal pain. Follow these instructions at home:  Do not  have sex or put anything in your vagina until your pain goes away completely.  Get plenty of rest until your pain improves.  Drink enough fluid to keep your urine pale yellow.  Take over-the-counter and prescription medicines only as told by your health care provider.  Keep all follow-up visits as told by your  health care provider. This is important. Contact a health care provider if:  Your pain continues or gets worse after resting.  You have lower abdominal pain that: ? Comes and goes at regular intervals. ? Spreads to your back. ? Is similar to menstrual cramps.  You have pain or burning when you urinate. Get help right away if:  You have a fever or chills.  You have vaginal bleeding.  You are leaking fluid from your vagina.  You are passing tissue from your vagina.  You have vomiting or diarrhea that lasts for more than 24 hours.  Your baby is moving less than usual.  You feel very weak or faint.  You have shortness of breath.  You develop severe pain in your upper abdomen. Summary  Abdominal pain is common during pregnancy, and has many possible causes.  If you experience abdominal pain during pregnancy, tell your health care provider right away.  Follow your health care provider's home care instructions and keep all follow-up visits as directed. This information is not intended to replace advice given to you by your health care provider. Make sure you discuss any questions you have with your health care provider. Document Revised: 09/25/2018 Document Reviewed: 09/09/2016 Elsevier Patient Education  2020 [employer].        First Trimester of Pregnancy The first trimester of pregnancy is from week 1 until the end of week 13 (months 1 through 3). A week after a sperm fertilizes an egg, the egg will implant on the wall of the uterus. This embryo will begin to develop into a baby. Genes from you and your partner will form the baby. The female genes will determine whether the baby will be a boy or a girl. At 6-8 weeks, the eyes and face will be formed, and the heartbeat can be seen on ultrasound. At the end of 12 weeks, all the baby's organs will be formed. Now that you are pregnant, you will want to do everything you can to have a healthy baby. Two of the most important  things are to get good prenatal care and to follow your health care provider's instructions. Prenatal care is all the medical care you receive before the baby's birth. This care will help prevent, find, and treat any problems during the pregnancy and childbirth. Body changes during your first trimester Your body goes through many changes during pregnancy. The changes vary from woman to woman.  You may gain or lose a couple of pounds at first.  You may feel sick to your stomach (nauseous) and you may throw up (vomit). If the vomiting is uncontrollable, call your health care provider.  You may tire easily.  You may develop headaches that can be relieved by medicines. All medicines should be approved by your health care provider.  You may urinate more often. Painful urination may mean you have a bladder infection.  You may develop heartburn as a result of your pregnancy.  You may develop constipation because certain hormones are causing the muscles that push stool through your intestines to slow down.  You may develop hemorrhoids or swollen veins (varicose veins).  Your breasts may begin  to grow larger and become tender. Your nipples may stick out more, and the tissue that surrounds them (areola) may become darker.  Your gums may bleed and may be sensitive to brushing and flossing.  Dark spots or blotches (chloasma, mask of pregnancy) may develop on your face. This will likely fade after the baby is born.  Your menstrual periods will stop.  You may have a loss of appetite.  You may develop cravings for certain kinds of food.  You may have changes in your emotions from day to day, such as being excited to be pregnant or being concerned that something may go wrong with the pregnancy and baby.  You may have more vivid and strange dreams.  You may have changes in your hair. These can include thickening of your hair, rapid growth, and changes in texture. Some women also have hair loss  during or after pregnancy, or hair that feels dry or thin. Your hair will most likely return to normal after your baby is born. What to expect at prenatal visits During a routine prenatal visit:  You will be weighed to make sure you and the baby are growing normally.  Your blood pressure will be taken.  Your abdomen will be measured to track your baby's growth.  The fetal heartbeat will be listened to between weeks 10 and 14 of your pregnancy.  Test results from any previous visits will be discussed. Your health care provider may ask you:  How you are feeling.  If you are feeling the baby move.  If you have had any abnormal symptoms, such as leaking fluid, bleeding, severe headaches, or abdominal cramping.  If you are using any tobacco products, including cigarettes, chewing tobacco, and electronic cigarettes.  If you have any questions. Other tests that may be performed during your first trimester include:  Blood tests to find your blood type and to check for the presence of any previous infections. The tests will also be used to check for low iron levels (anemia) and protein on red blood cells (Rh antibodies). Depending on your risk factors, or if you previously had diabetes during pregnancy, you may have tests to check for high blood sugar that affects pregnant women (gestational diabetes).  Urine tests to check for infections, diabetes, or protein in the urine.  An ultrasound to confirm the proper growth and development of the baby.  Fetal screens for spinal cord problems (spina bifida) and Down syndrome.  HIV (human immunodeficiency virus) testing. Routine prenatal testing includes screening for HIV, unless you choose not to have this test.  You may need other tests to make sure you and the baby are doing well. Follow these instructions at home: Medicines  Follow your health care provider's instructions regarding medicine use. Specific medicines may be either safe or  unsafe to take during pregnancy.  Take a prenatal vitamin that contains at least 600 micrograms (mcg) of folic acid.  If you develop constipation, try taking a stool softener if your health care provider approves. Eating and drinking   Eat a balanced diet that includes fresh fruits and vegetables, whole grains, good sources of protein such as meat, eggs, or tofu, and low-fat dairy. Your health care provider will help you determine the amount of weight gain that is right for you.  Avoid raw meat and uncooked cheese. These carry germs that can cause birth defects in the baby.  Eating four or five small meals rather than three large meals a day may help  relieve nausea and vomiting. If you start to feel nauseous, eating a few soda crackers can be helpful. Drinking liquids between meals, instead of during meals, also seems to help ease nausea and vomiting.  Limit foods that are high in fat and processed sugars, such as fried and sweet foods.  To prevent constipation: ? Eat foods that are high in fiber, such as fresh fruits and vegetables, whole grains, and beans. ? Drink enough fluid to keep your urine clear or pale yellow. Activity  Exercise only as directed by your health care provider. Most women can continue their usual exercise routine during pregnancy. Try to exercise for 30 minutes at least 5 days a week. Exercising will help you: ? Control your weight. ? Stay in shape. ? Be prepared for labor and delivery.  Experiencing pain or cramping in the lower abdomen or lower back is a good sign that you should stop exercising. Check with your health care provider before continuing with normal exercises.  Try to avoid standing for long periods of time. Move your legs often if you must stand in one place for a long time.  Avoid heavy lifting.  Wear low-heeled shoes and practice good posture.  You may continue to have sex unless your health care provider tells you not to. Relieving pain and  discomfort  Wear a good support bra to relieve breast tenderness.  Take warm sitz baths to soothe any pain or discomfort caused by hemorrhoids. Use hemorrhoid cream if your health care provider approves.  Rest with your legs elevated if you have leg cramps or low back pain.  If you develop varicose veins in your legs, wear support hose. Elevate your feet for 15 minutes, 3-4 times a day. Limit salt in your diet. Prenatal care  Schedule your prenatal visits by the twelfth week of pregnancy. They are usually scheduled monthly at first, then more often in the last 2 months before delivery.  Write down your questions. Take them to your prenatal visits.  Keep all your prenatal visits as told by your health care provider. This is important. Safety  Wear your seat belt at all times when driving.  Make a list of emergency phone numbers, including numbers for family, friends, the hospital, and police and fire departments. General instructions  Ask your health care provider for a referral to a local prenatal education class. Begin classes no later than the beginning of month 6 of your pregnancy.  Ask for help if you have counseling or nutritional needs during pregnancy. Your health care provider can offer advice or refer you to specialists for help with various needs.  Do not use hot tubs, steam rooms, or saunas.  Do not douche or use tampons or scented sanitary pads.  Do not cross your legs for long periods of time.  Avoid cat litter boxes and soil used by cats. These carry germs that can cause birth defects in the baby and possibly loss of the fetus by miscarriage or stillbirth.  Avoid all smoking, herbs, alcohol, and medicines not prescribed by your health care provider. Chemicals in these products affect the formation and growth of the baby.  Do not use any products that contain nicotine or tobacco, such as cigarettes and e-cigarettes. If you need help quitting, ask your health care  provider. You may receive counseling support and other resources to help you quit.  Schedule a dentist appointment. At home, brush your teeth with a soft toothbrush and be gentle when you floss. Contact  a health care provider if:  You have dizziness.  You have mild pelvic cramps, pelvic pressure, or nagging pain in the abdominal area.  You have persistent nausea, vomiting, or diarrhea.  You have a bad smelling vaginal discharge.  You have pain when you urinate.  You notice increased swelling in your face, hands, legs, or ankles.  You are exposed to fifth disease or chickenpox.  You are exposed to [place] measles (rubella) and have never had it. Get help right away if:  You have a fever.  You are leaking fluid from your vagina.  You have spotting or bleeding from your vagina.  You have severe abdominal cramping or pain.  You have rapid weight gain or loss.  You vomit blood or material that looks like coffee grounds.  You develop a severe headache.  You have shortness of breath.  You have any kind of trauma, such as from a fall or a car accident. Summary  The first trimester of pregnancy is from week 1 until the end of week 13 (months 1 through 3).  Your body goes through many changes during pregnancy. The changes vary from woman to woman.  You will have routine prenatal visits. During those visits, your health care provider will examine you, discuss any test results you may have, and talk with you about how you are feeling. This information is not intended to replace advice given to you by your health care provider. Make sure you discuss any questions you have with your health care provider. Document Revised: 05/20/2017 Document Reviewed: 05/19/2016 Elsevier Patient Education  2020 Elsevier Inc.        Morning Sickness  Morning sickness is when a woman feels nauseous during pregnancy. This nauseous feeling may or may not come with vomiting. It often occurs in  the morning, but it can be a problem at any time of day. Morning sickness is most common during the first trimester. In some cases, it may continue throughout pregnancy. Although morning sickness is unpleasant, it is usually harmless unless the woman develops severe and continual vomiting (hyperemesis gravidarum), a condition that requires more intense treatment. What are the causes? The exact cause of this condition is not known, but it seems to be related to normal hormonal changes that occur in pregnancy. What increases the risk? You are more likely to develop this condition if:  You experienced nausea or vomiting before your pregnancy.  You had morning sickness during a previous pregnancy.  You are pregnant with more than one baby, such as twins. What are the signs or symptoms? Symptoms of this condition include:  Nausea.  Vomiting. How is this diagnosed? This condition is usually diagnosed based on your signs and symptoms. How is this treated? In many cases, treatment is not needed for this condition. Making some changes to what you eat may help to control symptoms. Your health care provider may also prescribe or recommend:  Vitamin B6 supplements.  Anti-nausea medicines.  Ginger. Follow these instructions at home: Medicines  Take over-the-counter and prescription medicines only as told by your health care provider. Do not use any prescription, over-the-counter, or herbal medicines for morning sickness without first talking with your health care provider.  Taking multivitamins before getting pregnant can prevent or decrease the severity of morning sickness in most women. Eating and drinking  Eat a piece of dry toast or crackers before getting out of bed in the morning.  Eat 5 or 6 small meals a day.  Eat dry  and bland foods, such as rice or a baked potato. Foods that are high in carbohydrates are often helpful.  Avoid greasy, fatty, and spicy foods.  Have someone cook  for you if the smell of any food causes nausea and vomiting.  If you feel nauseous after taking prenatal vitamins, take the vitamins at night or with a snack.  Snack on protein foods between meals if you are hungry. Nuts, yogurt, and cheese are good options.  Drink fluids throughout the day.  Try ginger ale made with real ginger, ginger tea made from fresh grated ginger, or ginger candies. General instructions  Do not use any products that contain nicotine or tobacco, such as cigarettes and e-cigarettes. If you need help quitting, ask your health care provider.  Get an air purifier to keep the air in your house free of odors.  Get plenty of fresh air.  Try to avoid odors that trigger your nausea.  Consider trying these methods to help relieve symptoms: ? Wearing an acupressure wristband. These wristbands are often worn for seasickness. ? Acupuncture. Contact a health care provider if:  Your home remedies are not working and you need medicine.  You feel dizzy or light-headed.  You are losing weight. Get help right away if:  You have persistent and uncontrolled nausea and vomiting.  You faint.  You have severe pain in your abdomen. Summary  Morning sickness is when a woman feels nauseous during pregnancy. This nauseous feeling may or may not come with vomiting.  Morning sickness is most common during the first trimester.  It often occurs in the morning, but it can be a problem at any time of day.  In many cases, treatment is not needed for this condition. Making some changes to what you eat may help to control symptoms. This information is not intended to replace advice given to you by your health care provider. Make sure you discuss any questions you have with your health care provider. Document Revised: 05/20/2017 Document Reviewed: 07/10/2016 Elsevier Patient Education  2020 Elsevier Inc.        Hyperemesis Gravidarum Hyperemesis gravidarum is a severe form  of nausea and vomiting that happens during pregnancy. Hyperemesis is worse than morning sickness. It may cause you to have nausea or vomiting all day for many days. It may keep you from eating and drinking enough food and liquids, which can lead to dehydration, malnutrition, and weight loss. Hyperemesis usually occurs during the first half (the first 20 weeks) of pregnancy. It often goes away once a woman is in her second half of pregnancy. However, sometimes hyperemesis continues through an entire pregnancy. What are the causes? The cause of this condition is not known. It may be related to changes in chemicals (hormones) in the body during pregnancy, such as the high level of pregnancy hormone (human chorionic gonadotropin) or the increase in the female sex hormone (estrogen). What are the signs or symptoms? Symptoms of this condition include:  Nausea that does not go away.  Vomiting that does not allow you to keep any food down.  Weight loss.  Body fluid loss (dehydration).  Having no desire to eat, or not liking food that you have previously enjoyed. How is this diagnosed? This condition may be diagnosed based on:  A physical exam.  Your medical history.  Your symptoms.  Blood tests.  Urine tests. How is this treated? This condition is managed by controlling symptoms. This may include:  Following an eating plan. This can  help lessen nausea and vomiting.  Taking prescription medicines. An eating plan and medicines are often used together to help control symptoms. If medicines do not help relieve nausea and vomiting, you may need to receive fluids through an IV at the hospital. Follow these instructions at home: Eating and drinking   Avoid the following: ? Drinking fluids with meals. Try not to drink anything during the 30 minutes before and after your meals. ? Drinking more than 1 cup of fluid at a time. ? Eating foods that trigger your symptoms. These may include spicy  foods, coffee, high-fat foods, very sweet foods, and acidic foods. ? Skipping meals. Nausea can be more intense on an empty stomach. If you cannot tolerate food, do not force it. Try sucking on ice chips or other frozen items and make up for missed calories later. ? Lying down within 2 hours after eating. ? Being exposed to environmental triggers. These may include food smells, smoky rooms, closed spaces, rooms with strong smells, warm or humid places, overly loud and noisy rooms, and rooms with motion or flickering lights. Try eating meals in a well-ventilated area that is free of strong smells. ? Quick and sudden changes in your movement. ? Taking iron pills and multivitamins that contain iron. If you take prescription iron pills, do not stop taking them unless your health care provider approves. ? Preparing food. The smell of food can spoil your appetite or trigger nausea.  To help relieve your symptoms: ? Listen to your body. Everyone is different and has different preferences. Find what works best for you. ? Eat and drink slowly. ? Eat 5-6 small meals daily instead of 3 large meals. Eating small meals and snacks can help you avoid an empty stomach. ? In the morning, before getting out of bed, eat a couple of crackers to avoid moving around on an empty stomach. ? Try eating starchy foods as these are usually tolerated well. Examples include cereal, toast, bread, potatoes, pasta, rice, and pretzels. ? Include at least 1 serving of protein with your meals and snacks. Protein options include lean meats, poultry, seafood, beans, nuts, nut butters, eggs, cheese, and yogurt. ? Try eating a protein-rich snack before bed. Examples of a protein-rick snack include cheese and crackers or a peanut butter sandwich made with 1 slice of whole-wheat bread and 1 tsp (5 g) of peanut butter. ? Eat or suck on things that have ginger in them. It may help relieve nausea. Add  tsp ground ginger to hot tea or choose  ginger tea. ? Try drinking 100% fruit juice or an electrolyte drink. An electrolyte drink contains sodium, potassium, and chloride. ? Drink fluids that are cold, clear, and carbonated or sour. Examples include lemonade, ginger ale, lemon-lime soda, ice water, and sparkling water. ? Brush your teeth or use a mouth rinse after meals. ? Talk with your health care provider about starting a supplement of vitamin B6. General instructions  Take over-the-counter and prescription medicines only as told by your health care provider.  Follow instructions from your health care provider about eating or drinking restrictions.  Continue to take your prenatal vitamins as told by your health care provider. If you are having trouble taking your prenatal vitamins, talk with your health care provider about different options.  Keep all follow-up and pre-birth (prenatal) visits as told by your health care provider. This is important. Contact a health care provider if:  You have pain in your abdomen.  You have a  severe headache.  You have vision problems.  You are losing weight.  You feel weak or dizzy. Get help right away if:  You cannot drink fluids without vomiting.  You vomit blood.  You have constant nausea and vomiting.  You are very weak.  You faint.  You have a fever and your symptoms suddenly get worse. Summary  Hyperemesis gravidarum is a severe form of nausea and vomiting that happens during pregnancy.  Making some changes to your eating habits may help relieve nausea and vomiting.  This condition may be managed with medicine.  If medicines do not help relieve nausea and vomiting, you may need to receive fluids through an IV at the hospital. This information is not intended to replace advice given to you by your health care provider. Make sure you discuss any questions you have with your health care provider. Document Revised: 06/27/2017 Document Reviewed: 02/04/2016 Elsevier  Patient Education  2020 [employer].

## 2020-06-26 NOTE — MAU Provider Note (Signed)
History     CSN: [id number]  Arrival date and time: 06/26/20 1752   Event Date/Time   First Provider Initiated Contact with Patient 06/26/20 2129      Chief Complaint  Patient presents with  . Abdominal Pain  . Emesis   Ms. Barbara Mckinney is a 25 y.o. G2P0101 at [redacted]w[redacted]d who presents to MAU for nausea and vomiting, pelvic pain and lightheadedness with a headache. Patient reports N/V and pelvic pain started about 2 weeks ago. Patient reports when it started she was vomiting about 15 times daily, now she is vomiting about 5 times daily. Patient reports she can eat bread with jelly and chocolate and had rice and chicken earlier today. Patient reports she can drink coke, but has difficulty drinking water. Patient also reports pelvic pain has improved and reports it is mild menstrual-like cramping, mostly on the left side. Patient also reports mild lightheadedness and a headache at this time. Patient reports she has not taken anything for her headache.  Pt denies VB, vaginal discharge/odor/itching. Pt denies constipation, diarrhea, or urinary problems. Pt denies fever, chills, fatigue, sweating or changes in appetite. Pt denies SOB or chest pain. Pt denies dizziness, weakness.   OB History    Gravida  2   Para  1   Term      Preterm  1   AB      Living  1     SAB      IAB      Ectopic      Multiple  0   Live Births  1           No past medical history on file.  Past Surgical History:  Procedure Laterality Date  . LAPAROSCOPY N/A 01/07/2013   Procedure: LAPAROSCOPY DIAGNOSTIC;  Surgeon: Robyne Askew, MD;  Location: [facility] OR;  Service: General;  Laterality: N/A;  . 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  Social History   Tobacco Use  . Smoking status: Never Smoker  .  Smokeless tobacco: Never Used  Substance Use Topics  . Alcohol use: Yes    Comment: occ  . Drug use: No    Allergies:  Allergies  Allergen Reactions  . Bacitracin Rash    Medications Prior to Admission  Medication Sig Dispense Refill Last Dose  . ibuprofen (ADVIL,MOTRIN) 600 MG tablet Take 1 tablet (600 mg total) by mouth every 6 (six) hours as needed. (Patient not taking: No sig reported) 30 tablet 1     Review of Systems  Constitutional: Negative for chills, diaphoresis, fatigue and fever.  Eyes: Negative for visual disturbance.  Respiratory: Negative for shortness of breath.   Cardiovascular: Negative for chest pain.  Gastrointestinal: Positive for nausea and vomiting. Negative for abdominal pain, constipation and diarrhea.  Genitourinary: Positive for pelvic pain. Negative for dysuria, flank pain, frequency, urgency, vaginal bleeding and vaginal discharge.  Neurological: Positive for light-headedness and headaches. Negative for dizziness and weakness.   Physical Exam   Blood pressure 101/70, pulse 82, temperature 98.3 F (36.8 C), resp. rate 18, height 5\' 3"  (1.6 m), weight 52.2 kg, last menstrual period 04/18/2020.  Patient Vitals for the past 24 hrs:  BP Temp Temp src Pulse Resp SpO2 Height Weight  06/26/20 2215 - 99 F (37.2 C)  Oral 92 16 100 % - -  06/26/20 1900 101/70 98.3 F (36.8 C) - 82 18 - 5\' 3"  (1.6 m) 52.2 kg   Physical Exam Vitals and nursing note reviewed.  Constitutional:      General: She is not in acute distress.    Appearance: Normal appearance. She is not ill-appearing, toxic-appearing or diaphoretic.  HENT:     Head: Normocephalic and atraumatic.  Pulmonary:     Effort: Pulmonary effort is normal.  Neurological:     Mental Status: She is alert and oriented to person, place, and time.  Psychiatric:        Mood and Affect: Mood normal.        Behavior: Behavior normal.        Thought Content: Thought content normal.        Judgment: Judgment  normal.    Results for orders placed or performed during the hospital encounter of 06/26/20 (from the past 24 hour(s))  Urinalysis, Routine w reflex microscopic Urine, Clean Catch     Status: Abnormal   Collection Time: 06/26/20  7:01 PM  Result Value Ref Range   Color, Urine YELLOW YELLOW   APPearance CLEAR CLEAR   Specific Gravity, Urine 1.034 (H) 1.005 - 1.030   pH 5.0 5.0 - 8.0   Glucose, UA NEGATIVE NEGATIVE mg/dL   Hgb urine dipstick NEGATIVE NEGATIVE   Bilirubin Urine NEGATIVE NEGATIVE   Ketones, ur NEGATIVE NEGATIVE mg/dL   Protein, ur 30 (A) NEGATIVE mg/dL   Nitrite NEGATIVE NEGATIVE   Leukocytes,Ua NEGATIVE NEGATIVE   RBC / HPF 0-5 0 - 5 RBC/hpf   WBC, UA 0-5 0 - 5 WBC/hpf   Bacteria, UA NONE SEEN NONE SEEN   Squamous Epithelial / LPF 0-5 0 - 5   Mucus PRESENT   SARS CORONAVIRUS 2 (TAT 6-24 HRS) Nasopharyngeal Nasopharyngeal Swab     Status: None   Collection Time: 06/26/20 11:00 PM   Specimen: Nasopharyngeal Swab  Result Value Ref Range   SARS Coronavirus 2 NEGATIVE NEGATIVE   08/24/20 OB LESS THAN 14 WEEKS WITH OB TRANSVAGINAL  Result Date: 06/26/2020 CLINICAL DATA:  Initial evaluation for acute pain for 2 days, early pregnancy. EXAM: OBSTETRIC <14 WK 08/24/2020 AND TRANSVAGINAL OB US TECHNIQUE: Both transabdominal and transvaginal ultrasound examinations were performed for complete evaluation of the gestation as well as the maternal uterus, adnexal regions, and pelvic cul-de-sac. Transvaginal technique was performed to assess early pregnancy. COMPARISON:  None available. FINDINGS: Intrauterine gestational sac: Single Yolk sac:  Present Embryo:  Present Cardiac Activity: Present Heart Rate: 146 bpm CRL: 12.4 mm   7 w   3 d                  [place] EDC: 02/09/2021 Subchorionic hemorrhage:  None visualized. Maternal uterus/adnexae: Ovaries within normal limits. Corpus luteal cyst noted on the right. No adnexal mass or free fluid. IMPRESSION: 1. Single viable intrauterine pregnancy as above,  estimated gestational age [redacted] weeks and 3 days by crown-rump length, with ultrasound EDC of 02/09/2021. No complication. 2. No other acute maternal uterine or adnexal abnormality. Electronically Signed   By: 02/11/2021 M.D.   On: 06/26/2020 21:31    MAU Course  Procedures  MDM -N/V that is improving -pelvic pain that is improving -lightheadedness and HA likely d/t dehydration -UA: SG 1.034/30PRO -08/24/2020: single IUP, +yolk sac, +embryo, FHR 146, [redacted]w[redacted]d -consulted with Dr. [redacted]w[redacted]d, pt can be discharged home with prescription medication without treatment in  MAU -COVID test per Dr. Kennon Rounds, pending at time of discharge -pt discharged to home in stable condition  Orders Placed This Encounter  Procedures  . SARS CORONAVIRUS 2 (TAT 6-24 HRS) Nasopharyngeal Nasopharyngeal Swab    Standing Status:   Standing    Number of Occurrences:   1    Order Specific Question:   Is this test for diagnosis or screening    Answer:   Screening    Order Specific Question:   Symptomatic for COVID-19 as defined by CDC    Answer:   No    Order Specific Question:   Hospitalized for COVID-19    Answer:   No    Order Specific Question:   Admitted to ICU for COVID-19    Answer:   No    Order Specific Question:   Previously tested for COVID-19    Answer:   Yes    Order Specific Question:   Resident in a congregate (group) care setting    Answer:   Unknown    Order Specific Question:   Employed in healthcare setting    Answer:   Unknown    Order Specific Question:   Pregnant    Answer:   Yes    Order Specific Question:   Has patient completed COVID vaccination(s) (2 doses of Pfizer/Moderna 1 dose of [employer])    Answer:   Unknown  . US OB LESS THAN 14 WEEKS WITH OB TRANSVAGINAL    Standing Status:   Standing    Number of Occurrences:   1    Order Specific Question:   Symptom/Reason for Exam    Answer:   Abdominal pain affecting pregnancy [[id number]]  . Urinalysis, Routine w reflex microscopic Urine,  Clean Catch    Standing Status:   Standing    Number of Occurrences:   1  . Discharge patient    Order Specific Question:   Discharge disposition    Answer:   01-Home or Self Care [1]    Order Specific Question:   Discharge patient date    Answer:   06/26/2020   Meds ordered this encounter  Medications  . famotidine (PEPCID) 20 MG tablet    Sig: Take 1 tablet (20 mg total) by mouth daily.    Dispense:  30 tablet    Refill:  1    Order Specific Question:   Supervising Provider    Answer:   Lynnda Shields A [[id number]]  . metoCLOPramide (REGLAN) 10 MG tablet    Sig: Take 1 tablet (10 mg total) by mouth 3 (three) times daily with meals.    Dispense:  90 tablet    Refill:  1    Order Specific Question:   Supervising Provider    Answer:   Lynnda Shields A [[id number]]    Assessment and Plan   1. Nausea and vomiting in pregnancy   2. Abdominal pain affecting pregnancy   3. Intrauterine pregnancy    Allergies as of 06/26/2020      Reactions   Bacitracin Rash      Medication List    STOP taking these medications   ibuprofen 600 MG tablet Commonly known as: ADVIL     TAKE these medications   famotidine 20 MG tablet Commonly known as: Pepcid Take 1 tablet (20 mg total) by mouth daily.   metoCLOPramide 10 MG tablet Commonly known as: REGLAN Take 1 tablet (10 mg total) by mouth 3 (three) times daily with meals.      -  safe meds in pregnancy list given -OB providers list given, pt to start [facility] -return MAU precautions given -pt discharged to home in stable condition  Odie Sera Aissa Lisowski 06/26/2020, 9:43 PM

## 2020-06-26 NOTE — MAU Note (Signed)
Pt stated she has had N/v for about 2 weeks. C/o abd pain and cramping now for the past 2 days. Denies any vag bleeding reports normal white vag discharge.

## 2020-06-27 LAB — SARS CORONAVIRUS 2 (TAT 6-24 HRS): SARS Coronavirus 2: NEGATIVE

## 2020-06-30 ENCOUNTER — Ambulatory Visit: Payer: Medicaid Other | Admitting: Urology

## 2020-08-09 ENCOUNTER — Other Ambulatory Visit: Payer: Self-pay

## 2020-08-09 ENCOUNTER — Encounter (HOSPITAL_COMMUNITY): Payer: Self-pay | Admitting: Obstetrics & Gynecology

## 2020-08-09 ENCOUNTER — Inpatient Hospital Stay (HOSPITAL_COMMUNITY)
Admission: AD | Admit: 2020-08-09 | Discharge: 2020-08-09 | Disposition: A | Discharge status: Home / Self Care | Payer: Medicaid Other | Attending: Obstetrics & Gynecology | Admitting: Obstetrics & Gynecology

## 2020-08-09 DIAGNOSIS — O219 Vomiting of pregnancy, unspecified: Secondary | ICD-10-CM | POA: Diagnosis not present

## 2020-08-09 DIAGNOSIS — R519 Headache, unspecified: Secondary | ICD-10-CM | POA: Diagnosis not present

## 2020-08-09 DIAGNOSIS — Z3A13 13 weeks gestation of pregnancy: Secondary | ICD-10-CM | POA: Diagnosis not present

## 2020-08-09 DIAGNOSIS — O26891 Other specified pregnancy related conditions, first trimester: Secondary | ICD-10-CM | POA: Diagnosis not present

## 2020-08-09 DIAGNOSIS — O210 Mild hyperemesis gravidarum: Secondary | ICD-10-CM | POA: Diagnosis not present

## 2020-08-09 LAB — URINALYSIS, ROUTINE W REFLEX MICROSCOPIC
Bacteria, UA: NONE SEEN
Bilirubin Urine: NEGATIVE
Glucose, UA: NEGATIVE mg/dL
Ketones, ur: NEGATIVE mg/dL
Leukocytes,Ua: NEGATIVE
Nitrite: NEGATIVE
Protein, ur: NEGATIVE mg/dL
Specific Gravity, Urine: 1.002 — ABNORMAL LOW (ref 1.005–1.030)
pH: 7 (ref 5.0–8.0)

## 2020-08-09 LAB — BASIC METABOLIC PANEL
Anion gap: 12 (ref 5–15)
BUN: 7 mg/dL (ref 6–20)
CO2: 18 mmol/L — ABNORMAL LOW (ref 22–32)
Calcium: 9 mg/dL (ref 8.9–10.3)
Chloride: 104 mmol/L (ref 98–111)
Creatinine, Ser: 0.47 mg/dL (ref 0.44–1.00)
GFR, Estimated: >60 mL/min (ref >60)
Glucose, Bld: 84 mg/dL (ref 70–99)
Potassium: 3.7 mmol/L (ref 3.5–5.1)
Sodium: 134 mmol/L — ABNORMAL LOW (ref 135–145)

## 2020-08-09 LAB — HEMOGLOBIN AND HEMATOCRIT, BLOOD
HCT: 33.1 % — ABNORMAL LOW (ref 36.0–46.0)
Hemoglobin: 11.8 g/dL — ABNORMAL LOW (ref 12.0–15.0)

## 2020-08-09 MED ORDER — DOXYLAMINE-PYRIDOXINE 10-10 MG PO TBEC: DELAYED_RELEASE_TABLET | ORAL | 0 refills | Status: DC

## 2020-08-09 MED ORDER — METOCLOPRAMIDE HCL 5 MG/ML IJ SOLN
10.0000 mg | Freq: Once | INTRAMUSCULAR | Status: AC
  Administered 2020-08-09: 10 mg via INTRAVENOUS
  Filled 2020-08-09: qty 2

## 2020-08-09 MED ORDER — CYCLOBENZAPRINE HCL 5 MG PO TABS: 5.0000 mg | ORAL_TABLET | Freq: Three times a day (TID) | ORAL | 0 refills | Status: DC | PRN

## 2020-08-09 MED ORDER — LACTATED RINGERS IV BOLUS
1000.0000 mL | Freq: Once | INTRAVENOUS | Status: AC
  Administered 2020-08-09: 1000 mL via INTRAVENOUS

## 2020-08-09 MED ORDER — DEXAMETHASONE SODIUM PHOSPHATE 10 MG/ML IJ SOLN
10.0000 mg | Freq: Once | INTRAMUSCULAR | Status: AC
  Administered 2020-08-09: 10 mg via INTRAVENOUS
  Filled 2020-08-09: qty 1

## 2020-08-09 MED ORDER — DIPHENHYDRAMINE HCL 50 MG/ML IJ SOLN
25.0000 mg | Freq: Once | INTRAMUSCULAR | Status: AC
  Administered 2020-08-09: 25 mg via INTRAVENOUS
  Filled 2020-08-09: qty 1

## 2020-08-09 NOTE — Discharge Instructions (Signed)
For prevention of migraines in pregnancy: -Magnesium, 400mg  by mouth, once daily -Vitamin B2, 400mg  by mouth, once daily  For treatment of migraines in pregnancy: -take medication at the first sign of the pain of a headache, or the first sign of your aura -start with 1000mg  Tylenol (do not exceed 4000mg  of Tylenol in 24hrs), with or without Reglan 10mg  -if no relief after 1-2hours, can take Flexeril 5mg  -if the above regimen does not resolve your headache at all, please come to MAU for additional treatment      Morning Sickness  Morning sickness is when a woman feels nauseous during pregnancy. This nauseous feeling may or may not come with vomiting. It often occurs in the morning, but it can be a problem at any time of day. Morning sickness is most common during the first trimester. In some cases, it may continue throughout pregnancy. Although morning sickness is unpleasant, it is usually harmless unless the woman develops severe and continual vomiting (hyperemesis gravidarum), a condition that requires more intense treatment. What are the causes? The exact cause of this condition is not known, but it seems to be related to normal hormonal changes that occur in pregnancy. What increases the risk? You are more likely to develop this condition if:  You experienced nausea or vomiting before your pregnancy.  You had morning sickness during a previous pregnancy.  You are pregnant with more than one baby, such as twins. What are the signs or symptoms? Symptoms of this condition include:  Nausea.  Vomiting. How is this diagnosed? This condition is usually diagnosed based on your signs and symptoms. How is this treated? In many cases, treatment is not needed for this condition. Making some changes to what you eat may help to control symptoms. Your health care provider may also prescribe or recommend:  Vitamin B6 supplements.  Anti-nausea medicines.  Ginger. Follow these  instructions at home: Medicines  Take over-the-counter and prescription medicines only as told by your health care provider. Do not use any prescription, over-the-counter, or herbal medicines for morning sickness without first talking with your health care provider.  Take multivitamins before getting pregnant. This can prevent or decrease the severity of morning sickness in most women. Eating and drinking  Eat a piece of dry toast or crackers before getting out of bed in the morning.  Eat 5 or 6 small meals a day.  Eat dry and bland foods, such as rice or a baked potato. Foods that are high in carbohydrates are often helpful.  Avoid greasy, fatty, and spicy foods.  Have someone cook for you if the smell of any food causes nausea and vomiting.  If you feel nauseous after taking prenatal vitamins, take the vitamins at night or with a snack.  Eat a protein snack between meals if you are hungry. Nuts, yogurt, and cheese are good options.  Drink fluids throughout the day.  Try ginger ale made with real ginger, ginger tea made from fresh grated ginger, or ginger candies. General instructions  Do not use any products that contain nicotine or tobacco. These products include cigarettes, chewing tobacco, and vaping devices, such as e-cigarettes. If you need help quitting, ask your health care provider.  Get an air purifier to keep the air in your house free of odors.  Get plenty of fresh air.  Try to avoid odors that trigger your nausea.  Consider trying these methods to help relieve symptoms: ? Wearing an acupressure wristband. These wristbands are often worn for  seasickness. ? Acupuncture. Contact a health care provider if:  Your home remedies are not working and you need medicine.  You feel dizzy or light-headed.  You are losing weight. Get help right away if:  You have persistent and uncontrolled nausea and vomiting.  You faint.  You have severe pain in your  abdomen. Summary  Morning sickness is when a woman feels nauseous during pregnancy. This nauseous feeling may or may not come with vomiting.  Morning sickness is most common during the first trimester.  It often occurs in the morning, but it can be a problem at any time of day.  In many cases, treatment is not needed for this condition. Making some changes to what you eat may help to control symptoms. This information is not intended to replace advice given to you by your health care provider. Make sure you discuss any questions you have with your health care provider. Document Revised: 01/21/2020 Document Reviewed: 12/31/2019 Elsevier Patient Education  2021 [employer].

## 2020-08-09 NOTE — MAU Note (Signed)
Pt reports to mau with c/o n,v, and headache for the past 3 weeks.  Pt also reports dizziness for the past 3 weeks.  Pt last took tylenol 2 days ago.  Pt denies abd pain or bleeding.

## 2020-08-09 NOTE — MAU Provider Note (Signed)
History     CSN: [id number]  Arrival date and time: 08/09/20 1539   Event Date/Time   First Provider Initiated Contact with Patient 08/09/20 1614      Chief Complaint  Patient presents with  . Headache  . Dizziness  . Emesis   HPI Barbara Mckinney is a 25 y.o. G2P0101 at [redacted]w[redacted]d who presents with headache & nausea. Symptoms have been going on for the last 3 weeks. Reports daily nausea & vomiting. Normally vomits about 5 times per day. Is taking pepcid & reglan with minimal relief. Isn't able to keep down much food and feel dehydrated. Also reports daily headaches for the last 3 weeks. Pain varies in location throughout her head. Normally has some dizziness & "heaviness" feelings with her headaches. Has been taking 1 ES tylenol for headaches but felt like it wasn't working so hasn't been taking anything for them recently. Current headache she rates 5/10. Nothing makes pain better or worse. Denies fever, abdominal pain, vaginal bleeding, or diarrhea.   OB History    Gravida  2   Para  1   Term      Preterm  1   AB      Living  1     SAB      IAB      Ectopic      Multiple  0   Live Births  1           History reviewed. No pertinent past medical history.  Past Surgical History:  Procedure Laterality Date  . LAPAROSCOPY N/A 01/07/2013   Procedure: LAPAROSCOPY DIAGNOSTIC;  Surgeon: Robyne Askew, MD;  Location: [facility] OR;  Service: General;  Laterality: N/A;  . 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 No pertinent family history.  Social History   Tobacco Use  . Smoking status: Never Smoker  . Smokeless tobacco: Never Used  Substance Use Topics  . Alcohol use: Not Currently    Comment: occ  . Drug use: No    Allergies:  Allergies  Allergen Reactions  . Bacitracin Rash     Medications Prior to Admission  Medication Sig Dispense Refill Last Dose  . famotidine (PEPCID) 20 MG tablet Take 1 tablet (20 mg total) by mouth daily. 30 tablet 1 08/09/2020 at Unknown time  . metoCLOPramide (REGLAN) 10 MG tablet Take 1 tablet (10 mg total) by mouth 3 (three) times daily with meals. 90 tablet 1 08/09/2020 at Unknown time    Review of Systems  Constitutional: Negative.   Gastrointestinal: Positive for nausea and vomiting. Negative for abdominal pain, constipation and diarrhea.  Genitourinary: Negative.   Neurological: Positive for dizziness and headaches. Negative for syncope.   Physical Exam   Blood pressure 130/80, pulse 88, temperature 97.8 F (36.6 C), temperature source Oral, resp. rate 15, last menstrual period 04/18/2020, SpO2 99 %.  Physical Exam Vitals and nursing note reviewed.  Constitutional:      General: She is not in acute distress.    Appearance: She is well-developed and normal weight.  HENT:     Head: Normocephalic and atraumatic.  Cardiovascular:     Rate and Rhythm: Normal rate and regular rhythm.     Heart sounds: Normal heart sounds.  Pulmonary:     Effort: Pulmonary effort is normal. No respiratory  distress.     Breath sounds: Normal breath sounds. No wheezing.  Abdominal:     General: Bowel sounds are normal. There is no distension.     Palpations: Abdomen is soft.     Tenderness: There is no abdominal tenderness.  Skin:    General: Skin is warm and dry.  Neurological:     Mental Status: She is alert.  Psychiatric:        Mood and Affect: Mood normal.        Behavior: Behavior normal.     MAU Course  Procedures Results for orders placed or performed during the hospital encounter of 08/09/20 (from the past 24 hour(s))  Urinalysis, Routine w reflex microscopic Urine, Clean Catch     Status: Abnormal   Collection Time: 08/09/20  4:44 PM  Result Value Ref Range   Color, Urine COLORLESS (A) YELLOW   APPearance CLEAR CLEAR    Specific Gravity, Urine 1.002 (L) 1.005 - 1.030   pH 7.0 5.0 - 8.0   Glucose, UA NEGATIVE NEGATIVE mg/dL   Hgb urine dipstick MODERATE (A) NEGATIVE   Bilirubin Urine NEGATIVE NEGATIVE   Ketones, ur NEGATIVE NEGATIVE mg/dL   Protein, ur NEGATIVE NEGATIVE mg/dL   Nitrite NEGATIVE NEGATIVE   Leukocytes,Ua NEGATIVE NEGATIVE   RBC / HPF 0-5 0 - 5 RBC/hpf   WBC, UA 0-5 0 - 5 WBC/hpf   Bacteria, UA NONE SEEN NONE SEEN   Squamous Epithelial / LPF 0-5 0 - 5  Hemoglobin and hematocrit, blood     Status: Abnormal   Collection Time: 08/09/20  4:44 PM  Result Value Ref Range   Hemoglobin 11.8 (L) 12.0 - 15.0 g/dL   HCT 91.6 (L) 38.4 - 66.5 %  Basic metabolic panel     Status: Abnormal   Collection Time: 08/09/20  4:44 PM  Result Value Ref Range   Sodium 134 (L) 135 - 145 mmol/L   Potassium 3.7 3.5 - 5.1 mmol/L   Chloride 104 98 - 111 mmol/L   CO2 18 (L) 22 - 32 mmol/L   Glucose, Bld 84 70 - 99 mg/dL   BUN 7 6 - 20 mg/dL   Creatinine, Ser 9.93 0.44 - 1.00 mg/dL   Calcium 9.0 8.9 - 57.0 mg/dL   GFR, Estimated >17 >79 mL/min   Anion gap 12 5 - 15    MDM FHT present via doppler  IV headache cocktail (reglan, decadron, benadryl) given for headache, will also treat nausea.  Patient reports improvement in symptoms.   Assessment and Plan   1. Pregnancy headache in first trimester   2. Nausea and vomiting during pregnancy prior to [redacted] weeks gestation   3. [redacted] weeks gestation of pregnancy    -Rx diclegis. Continue to take pepcid & reglan for nausea -Rx flexeril. Take with tylenol and/or reglan for future headaches  [name] 08/09/2020, 6:07 PM

## 2020-08-18 ENCOUNTER — Ambulatory Visit (INDEPENDENT_AMBULATORY_CARE_PROVIDER_SITE_OTHER): Payer: Medicaid Other | Admitting: Urology

## 2020-08-18 ENCOUNTER — Encounter: Payer: Self-pay | Admitting: Urology

## 2020-08-18 ENCOUNTER — Other Ambulatory Visit: Payer: Self-pay

## 2020-08-18 VITALS — BP 111/72 | HR 87 | Temp 98.4°F | Ht 63.0 in

## 2020-08-18 DIAGNOSIS — Z87440 Personal history of urinary (tract) infections: Secondary | ICD-10-CM

## 2020-08-18 LAB — URINALYSIS, ROUTINE W REFLEX MICROSCOPIC
Bilirubin, UA: NEGATIVE
Glucose, UA: NEGATIVE
Ketones, UA: NEGATIVE
Leukocytes,UA: NEGATIVE
Nitrite, UA: NEGATIVE
Protein,UA: NEGATIVE
RBC, UA: NEGATIVE
Specific Gravity, UA: <1.005 — ABNORMAL LOW (ref 1.005–1.030)
Urobilinogen, Ur: 0.2 mg/dL (ref 0.2–1.0)
pH, UA: 6.5 (ref 5.0–7.5)

## 2020-08-18 LAB — BLADDER SCAN AMB NON-IMAGING: Scan Result: 25

## 2020-08-18 NOTE — Progress Notes (Signed)
08/18/2020 3:46 PM   Barbara Mckinney 11-12-1995 [id number]  Referring provider: Ivonne Andrew, NP [address] [place],  [place] [address]  UTI  HPI: [name] is a 25yo here for evaluation of UTIs. She had a UTI in 2015 and a second UTI in Oct 2021. She is currently [redacted] weeks pregnant. She was treated with keflex and then doxycycline. No LUTS currently. No hx of nephrolithiasis. No flank pain. No other complaints today.    PMH: No past medical history on file.  Surgical History: Past Surgical History:  Procedure Laterality Date  . LAPAROSCOPY N/A 01/07/2013   Procedure: LAPAROSCOPY DIAGNOSTIC;  Surgeon: Robyne Askew, MD;  Location: [facility] OR;  Service: General;  Laterality: N/A;  . 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 If you have any questions, ask your nurse or doctor.        cyclobenzaprine 5 MG tablet Commonly known as: FLEXERIL Take 1 tablet (5 mg total) by mouth 3 (three) times daily as needed (take with tylenol for headaches).   Doxylamine-Pyridoxine 10-10 MG Tbec Commonly known as: Diclegis Take 2 tabs at bedtime. If needed, add another tab in the morning. If needed, add another tab in the afternoon, up to 4 tabs/day.   famotidine 20 MG tablet Commonly known as: Pepcid Take 1 tablet (20 mg total) by mouth daily.   metoCLOPramide 10 MG tablet Commonly known as: REGLAN Take 1 tablet (10 mg total) by mouth 3 (three) times daily with meals.       Allergies:  Allergies  Allergen Reactions  . Bacitracin Rash    Family History: No family history on file.  Social History:  reports that she has never smoked. She has never used smokeless tobacco.  She reports previous alcohol use. She reports that she does not use drugs.  ROS: All other review of systems were reviewed and are negative except what is noted above in HPI  Physical Exam: BP 111/72   Pulse 87   Temp 98.4 F (36.9 C)   Ht 5\' 3"  (1.6 m)   LMP 04/18/2020   BMI 20.37 kg/m   Constitutional:  Alert and oriented, No acute distress. HEENT: Elim AT, moist mucus membranes.  Trachea midline, no masses. Cardiovascular: No clubbing, cyanosis, or edema. Respiratory: Normal respiratory effort, no increased work of breathing. GI: Abdomen is soft, nontender, nondistended, no abdominal masses GU: No CVA tenderness.  Lymph: No cervical or inguinal lymphadenopathy. Skin: No rashes, bruises or suspicious lesions. Neurologic: Grossly intact, no focal deficits, moving all 4 extremities. Psychiatric: Normal mood and affect.  Laboratory Data: Lab Results  Component Value Date   WBC 8.7 04/07/2020   HGB 11.8 (L) 08/09/2020   HCT 33.1 (L) 08/09/2020   MCV 91 04/07/2020   PLT 243 04/07/2020    Lab Results  Component Value Date   CREATININE 0.47 08/09/2020    No results found for: PSA  No results found for: TESTOSTERONE  No results found for: HGBA1C  Urinalysis    Component Value Date/Time   COLORURINE COLORLESS (A) 08/09/2020 1644   APPEARANCEUR CLEAR 08/09/2020  1644   LABSPEC 1.002 (L) 08/09/2020 1644   PHURINE 7.0 08/09/2020 1644   GLUCOSEU NEGATIVE 08/09/2020 1644   HGBUR MODERATE (A) 08/09/2020 1644   BILIRUBINUR NEGATIVE 08/09/2020 1644   BILIRUBINUR neg 03/24/2020 1458   KETONESUR NEGATIVE 08/09/2020 1644   PROTEINUR NEGATIVE 08/09/2020 1644   UROBILINOGEN 0.2 03/24/2020 1458   UROBILINOGEN 0.2 12/21/2013 0329   NITRITE NEGATIVE 08/09/2020 1644   LEUKOCYTESUR NEGATIVE 08/09/2020 1644    Lab Results  Component Value Date   BACTERIA NONE SEEN 08/09/2020    Pertinent Imaging:  No results found for this or any previous visit.  No results found for this  or any previous visit.  No results found for this or any previous visit.  No results found for this or any previous visit.  No results found for this or any previous visit.  No results found for this or any previous visit.  No results found for this or any previous visit.  No results found for this or any previous visit.   Assessment & Plan:    1. History of UTI -RTC 6 months - Urinalysis, Routine w reflex microscopic - BLADDER SCAN AMB NON-IMAGING   No follow-ups on file.  Wilkie Aye, MD  [facility] [place]

## 2020-08-18 NOTE — Progress Notes (Signed)
Bladder Scan Patient can void: 25 ml Performed By: Bridgette Habermann, lpn   Urological Symptom Review  Patient is experiencing the following symptoms: Frequent urination Get up at night to urinate Urinary tract infection  Currently pregnant   Review of Systems  Gastrointestinal (upper)  : Negative for upper GI symptoms  Gastrointestinal (lower) : Negative for lower GI symptoms  Constitutional : Negative for symptoms  Skin: Negative for skin symptoms  Eyes: Negative for eye symptoms  Ear/Nose/Throat : Negative for Ear/Nose/Throat symptoms  Hematologic/Lymphatic: Negative for Hematologic/Lymphatic symptoms  Cardiovascular : Negative for cardiovascular symptoms  Respiratory : Negative for respiratory symptoms  Endocrine: Negative for endocrine symptoms  Musculoskeletal: Negative for musculoskeletal symptoms  Neurological: Negative for neurological symptoms  Psychologic: Negative for psychiatric symptoms

## 2020-08-18 NOTE — Patient Instructions (Signed)
Pregnancy and Urinary Tract Infection  A urinary tract infection (UTI) is an infection of any part of the urinary tract. This includes the kidneys, the tubes that connect your kidneys to your bladder (ureters), the bladder, and the tube that carries urine out of your body (urethra). These organs make, store, and get rid of urine in the body. Your health care provider may use other names to describe the infection. An upper UTI affects the ureters and kidneys (pyelonephritis). A lower UTI affects the bladder (cystitis) and urethra (urethritis). Most urinary tract infections are caused by bacteria in your genital area, around the entrance to your urinary tract (urethra). These bacteria grow and cause irritation and inflammation of your urinary tract. You are more likely to develop a UTI during pregnancy because the physical and hormonal changes your body goes through can make it easier for bacteria to get into your urinary tract. Your growing baby also puts pressure on your bladder and can affect urine flow. It is important to recognize and treat UTIs in pregnancy because of the risk of serious complications for both you and your baby. How does this affect me? Symptoms of a UTI include:  Needing to urinate right away (urgently).  Frequent urination or passing small amounts of urine frequently.  Pain or burning with urination.  Blood in the urine.  Urine that smells bad or unusual.  Trouble urinating.  Cloudy urine.  Pain in the abdomen or lower back.  Vaginal discharge. You may also have:  Vomiting or a decreased appetite.  Confusion.  Irritability or tiredness.  A fever.  Diarrhea. How does this affect my baby? An untreated UTI during pregnancy could lead to a kidney infection or a systemic infection, which can cause health problems that could affect your baby. Possible complications of an untreated UTI include:  Giving birth to your baby before 37 weeks of pregnancy  (premature).  Having a baby with a low birth weight.  Developing high blood pressure during pregnancy (preeclampsia).  Having a low hemoglobin level (anemia). What can I do to lower my risk? To prevent a UTI:  Go to the bathroom as soon as you feel the need. Do not hold urine for long periods of time.  Always wipe from front to back, especially after a bowel movement. Use each tissue one time when you wipe.  Empty your bladder after sex.  Keep your genital area dry.  Drink 6-10 glasses of water each day.  Do not douche or use deodorant sprays. How is this treated? Treatment for this condition may include:  Antibiotic medicines that are safe to take during pregnancy.  Other medicines to treat less common causes of UTI. Follow these instructions at home:  If you were prescribed an antibiotic medicine, take it as told by your health care provider. Do not stop using the antibiotic even if you start to feel better.  Keep all follow-up visits as told by your health care provider. This is important. Contact a health care provider if:  Your symptoms do not improve or they get worse.  You have abnormal vaginal discharge. Get help right away if you:  Have a fever.  Have nausea and vomiting.  Have back or side pain.  Feel contractions in your uterus.  Have lower belly pain.  Have a gush of fluid from your vagina.  Have blood in your urine. Summary  A urinary tract infection (UTI) is an infection of any part of the urinary tract, which includes the   kidneys, ureters, bladder, and urethra.  Most urinary tract infections are caused by bacteria in your genital area, around the entrance to your urinary tract (urethra).  You are more likely to develop a UTI during pregnancy.  If you were prescribed an antibiotic medicine, take it as told by your health care provider. Do not stop using the antibiotic even if you start to feel better. This information is not intended to  replace advice given to you by your health care provider. Make sure you discuss any questions you have with your health care provider. Document Revised: 09/29/2018 Document Reviewed: 05/11/2018 Elsevier Patient Education  2021 [employer]  

## 2020-08-20 ENCOUNTER — Other Ambulatory Visit: Payer: Self-pay

## 2020-08-20 ENCOUNTER — Encounter: Payer: Self-pay | Admitting: Family Medicine

## 2020-08-20 ENCOUNTER — Ambulatory Visit (INDEPENDENT_AMBULATORY_CARE_PROVIDER_SITE_OTHER): Payer: Medicaid Other | Admitting: Family Medicine

## 2020-08-20 VITALS — BP 101/64 | HR 108 | Wt 114.6 lb

## 2020-08-20 DIAGNOSIS — O0990 Supervision of high risk pregnancy, unspecified, unspecified trimester: Secondary | ICD-10-CM | POA: Insufficient documentation

## 2020-08-20 DIAGNOSIS — Z23 Encounter for immunization: Secondary | ICD-10-CM | POA: Diagnosis not present

## 2020-08-20 DIAGNOSIS — Z3480 Encounter for supervision of other normal pregnancy, unspecified trimester: Secondary | ICD-10-CM

## 2020-08-20 DIAGNOSIS — Z8751 Personal history of pre-term labor: Secondary | ICD-10-CM

## 2020-08-20 DIAGNOSIS — F411 Generalized anxiety disorder: Secondary | ICD-10-CM

## 2020-08-20 DIAGNOSIS — Z3482 Encounter for supervision of other normal pregnancy, second trimester: Secondary | ICD-10-CM | POA: Diagnosis not present

## 2020-08-20 HISTORY — DX: Personal history of pre-term labor: Z87.51

## 2020-08-20 MED ORDER — BLOOD PRESSURE MONITORING DEVI: 1.0000 | 0 refills | Status: DC

## 2020-08-20 MED ORDER — PRENATAL PLUS 27-1 MG PO TABS
1.0000 | ORAL_TABLET | Freq: Every day | ORAL | 11 refills | Status: DC
Start: 2020-08-20 — End: 2021-03-27

## 2020-08-20 NOTE — Patient Instructions (Signed)
 Second Trimester of Pregnancy  The second trimester of pregnancy is from week 13 through week 27. This is months 4 through 6 of pregnancy. The second trimester is often a time when you feel your best. Your body has adjusted to being pregnant, and you begin to feel better physically. During the second trimester:  Morning sickness has lessened or stopped completely.  You may have more energy.  You may have an increase in appetite. The second trimester is also a time when the unborn baby (fetus) is growing rapidly. At the end of the sixth month, the fetus may be up to 12 inches long and weigh about 1 pounds. You will likely begin to feel the baby move (quickening) between 16 and 20 weeks of pregnancy. Body changes during your second trimester Your body continues to go through many changes during your second trimester. The changes vary and generally return to normal after the baby is born. Physical changes  Your weight will continue to increase. You will notice your lower abdomen bulging out.  You may begin to get stretch marks on your hips, abdomen, and breasts.  Your breasts will continue to grow and to become tender.  Dark spots or blotches (chloasma or mask of pregnancy) may develop on your face.  A dark line from your belly button to the pubic area (linea nigra) may appear.  You may have changes in your hair. These can include thickening of your hair, rapid growth, and changes in texture. Some people also have hair loss during or after pregnancy, or hair that feels dry or thin. Health changes  You may develop headaches.  You may have heartburn.  You may develop constipation.  You may develop hemorrhoids or swollen, bulging veins (varicose veins).  Your gums may bleed and may be sensitive to brushing and flossing.  You may urinate more often because the fetus is pressing on your bladder.  You may have back pain. This is caused by: ? Weight gain. ? Pregnancy hormones  that are relaxing the joints in your pelvis. ? A shift in weight and the muscles that support your balance. Follow these instructions at home: Medicines  Follow your health care provider's instructions regarding medicine use. Specific medicines may be either safe or unsafe to take during pregnancy. Do not take any medicines unless approved by your health care provider.  Take a prenatal vitamin that contains at least 600 micrograms (mcg) of folic acid. Eating and drinking  Eat a healthy diet that includes fresh fruits and vegetables, whole grains, good sources of protein such as meat, eggs, or tofu, and low-fat dairy products.  Avoid raw meat and unpasteurized juice, milk, and cheese. These carry germs that can harm you and your baby.  You may need to take these actions to prevent or treat constipation: ? Drink enough fluid to keep your urine pale yellow. ? Eat foods that are high in fiber, such as beans, whole grains, and fresh fruits and vegetables. ? Limit foods that are high in fat and processed sugars, such as fried or sweet foods. Activity  Exercise only as directed by your health care provider. Most people can continue their usual exercise routine during pregnancy. Try to exercise for 30 minutes at least 5 days a week. Stop exercising if you develop contractions in your uterus.  Stop exercising if you develop pain or cramping in the lower abdomen or lower back.  Avoid exercising if it is very hot or humid or if you are   at a high altitude.  Avoid heavy lifting.  If you choose to, you may have sex unless your health care provider tells you not to. Relieving pain and discomfort  Wear a supportive bra to prevent discomfort from breast tenderness.  Take warm sitz baths to soothe any pain or discomfort caused by hemorrhoids. Use hemorrhoid cream if your health care provider approves.  Rest with your legs raised (elevated) if you have leg cramps or low back pain.  If you develop  varicose veins: ? Wear support hose as told by your health care provider. ? Elevate your feet for 15 minutes, 3-4 times a day. ? Limit salt in your diet. Safety  Wear your seat belt at all times when driving or riding in a car.  Talk with your health care provider if someone is verbally or physically abusive to you. Lifestyle  Do not use hot tubs, steam rooms, or saunas.  Do not douche. Do not use tampons or scented sanitary pads.  Avoid cat litter boxes and soil used by cats. These carry germs that can cause birth defects in the baby and possibly loss of the fetus by miscarriage or stillbirth.  Do not use herbal remedies, alcohol, illegal drugs, or medicines that are not approved by your health care provider. Chemicals in these products can harm your baby.  Do not use any products that contain nicotine or tobacco, such as cigarettes, e-cigarettes, and chewing tobacco. If you need help quitting, ask your health care provider. General instructions  During a routine prenatal visit, your health care provider will do a physical exam and other tests. He or she will also discuss your overall health. Keep all follow-up visits. This is important.  Ask your health care provider for a referral to a local prenatal education class.  Ask for help if you have counseling or nutritional needs during pregnancy. Your health care provider can offer advice or refer you to specialists for help with various needs. Where to find more information  American Pregnancy Association: americanpregnancy.org  American College of Obstetricians and Gynecologists: acog.org/en/Womens%20Health/Pregnancy  Office on Women's Health: womenshealth.gov/pregnancy Contact a health care provider if you have:  A headache that does not go away when you take medicine.  Vision changes or you see spots in front of your eyes.  Mild pelvic cramps, pelvic pressure, or nagging pain in the abdominal area.  Persistent nausea,  vomiting, or diarrhea.  A bad-smelling vaginal discharge or foul-smelling urine.  Pain when you urinate.  Sudden or extreme swelling of your face, hands, ankles, feet, or legs.  A fever. Get help right away if you:  Have fluid leaking from your vagina.  Have spotting or bleeding from your vagina.  Have severe abdominal cramping or pain.  Have difficulty breathing.  Have chest pain.  Have fainting spells.  Have not felt your baby move for the time period told by your health care provider.  Have new or increased pain, swelling, or redness in an arm or leg. Summary  The second trimester of pregnancy is from week 13 through week 27 (months 4 through 6).  Do not use herbal remedies, alcohol, illegal drugs, or medicines that are not approved by your health care provider. Chemicals in these products can harm your baby.  Exercise only as directed by your health care provider. Most people can continue their usual exercise routine during pregnancy.  Keep all follow-up visits. This is important. This information is not intended to replace advice given to you by   your health care provider. Make sure you discuss any questions you have with your health care provider. Document Revised: 11/14/2019 Document Reviewed: 09/20/2019 Elsevier Patient Education  2021 [employer]   Contraception Choices Contraception, also called birth control, refers to methods or devices that prevent pregnancy. Hormonal methods Contraceptive implant A contraceptive implant is a thin, plastic tube that contains a hormone that prevents pregnancy. It is different from an intrauterine device (IUD). It is inserted into the upper part of the arm by a health care provider. Implants can be effective for up to 3 years. Progestin-only injections Progestin-only injections are injections of progestin, a synthetic form of the hormone progesterone. They are given every 3 months by a health care provider. Birth control  pills Birth control pills are pills that contain hormones that prevent pregnancy. They must be taken once a day, preferably at the same time each day. A prescription is needed to use this method of contraception. Birth control patch The birth control patch contains hormones that prevent pregnancy. It is placed on the skin and must be changed once a week for three weeks and removed on the fourth week. A prescription is needed to use this method of contraception. Vaginal ring A vaginal ring contains hormones that prevent pregnancy. It is placed in the vagina for three weeks and removed on the fourth week. After that, the process is repeated with a new ring. A prescription is needed to use this method of contraception. Emergency contraceptive Emergency contraceptives prevent pregnancy after unprotected sex. They come in pill form and can be taken up to 5 days after sex. They work best the sooner they are taken after having sex. Most emergency contraceptives are available without a prescription. This method should not be used as your only form of birth control.   Barrier methods Female condom A female condom is a thin sheath that is worn over the penis during sex. Condoms keep sperm from going inside a woman's body. They can be used with a sperm-killing substance (spermicide) to increase their effectiveness. They should be thrown away after one use. Female condom A female condom is a soft, loose-fitting sheath that is put into the vagina before sex. The condom keeps sperm from going inside a woman's body. They should be thrown away after one use. Diaphragm A diaphragm is a soft, dome-shaped barrier. It is inserted into the vagina before sex, along with a spermicide. The diaphragm blocks sperm from entering the uterus, and the spermicide kills sperm. A diaphragm should be left in the vagina for 6-8 hours after sex and removed within 24 hours. A diaphragm is prescribed and fitted by a health care provider. A  diaphragm should be replaced every 1-2 years, after giving birth, after gaining more than 15 lb (6.8 kg), and after pelvic surgery. Cervical cap A cervical cap is a round, soft latex or plastic cup that fits over the cervix. It is inserted into the vagina before sex, along with spermicide. It blocks sperm from entering the uterus. The cap should be left in place for 6-8 hours after sex and removed within 48 hours. A cervical cap must be prescribed and fitted by a health care provider. It should be replaced every 2 years. Sponge A sponge is a soft, circular piece of polyurethane foam with spermicide in it. The sponge helps block sperm from entering the uterus, and the spermicide kills sperm. To use it, you make it wet and then insert it into the vagina. It should be   inserted before sex, left in for at least 6 hours after sex, and removed and thrown away within 30 hours. Spermicides Spermicides are chemicals that kill or block sperm from entering the cervix and uterus. They can come as a cream, jelly, suppository, foam, or tablet. A spermicide should be inserted into the vagina with an applicator at least 10-15 minutes before sex to allow time for it to work. The process must be repeated every time you have sex. Spermicides do not require a prescription.   Intrauterine contraception Intrauterine device (IUD) An IUD is a T-shaped device that is put in a woman's uterus. There are two types:  Hormone IUD.This type contains progestin, a synthetic form of the hormone progesterone. This type can stay in place for 3-5 years.  Copper IUD.This type is wrapped in copper wire. It can stay in place for 10 years. Permanent methods of contraception Female tubal ligation In this method, a woman's fallopian tubes are sealed, tied, or blocked during surgery to prevent eggs from traveling to the uterus. Hysteroscopic sterilization In this method, a small, flexible insert is placed into each fallopian tube. The inserts  cause scar tissue to form in the fallopian tubes and block them, so sperm cannot reach an egg. The procedure takes about 3 months to be effective. Another form of birth control must be used during those 3 months. Female sterilization This is a procedure to tie off the tubes that carry sperm (vasectomy). After the procedure, the man can still ejaculate fluid (semen). Another form of birth control must be used for 3 months after the procedure. Natural planning methods Natural family planning In this method, a couple does not have sex on days when the woman could become pregnant. Calendar method In this method, the woman keeps track of the length of each menstrual cycle, identifies the days when pregnancy can happen, and does not have sex on those days. Ovulation method In this method, a couple avoids sex during ovulation. Symptothermal method This method involves not having sex during ovulation. The woman typically checks for ovulation by watching changes in her temperature and in the consistency of cervical mucus. Post-ovulation method In this method, a couple waits to have sex until after ovulation. Where to find more information  Centers for Disease Control and Prevention: www.cdc.gov Summary  Contraception, also called birth control, refers to methods or devices that prevent pregnancy.  Hormonal methods of contraception include implants, injections, pills, patches, vaginal rings, and emergency contraceptives.  Barrier methods of contraception can include female condoms, female condoms, diaphragms, cervical caps, sponges, and spermicides.  There are two types of IUDs (intrauterine devices). An IUD can be put in a woman's uterus to prevent pregnancy for 3-5 years.  Permanent sterilization can be done through a procedure for males and females. Natural family planning methods involve nothaving sex on days when the woman could become pregnant. This information is not intended to replace advice  given to you by your health care provider. Make sure you discuss any questions you have with your health care provider. Document Revised: 11/12/2019 Document Reviewed: 11/12/2019 Elsevier Patient Education  2021 [employer] Inc.   Breastfeeding  Choosing to breastfeed is one of the best decisions you can make for yourself and your baby. A change in hormones during pregnancy causes your breasts to make breast milk in your milk-producing glands. Hormones prevent breast milk from being released before your baby is born. They also prompt milk flow after birth. Once breastfeeding has begun, thoughts of   your baby, as well as his or her sucking or crying, can stimulate the release of milk from your milk-producing glands. Benefits of breastfeeding Research shows that breastfeeding offers many health benefits for infants and mothers. It also offers a cost-free and convenient way to feed your baby. For your baby  Your first milk (colostrum) helps your baby's digestive system to function better.  Special cells in your milk (antibodies) help your baby to fight off infections.  Breastfed babies are less likely to develop asthma, allergies, obesity, or type 2 diabetes. They are also at lower risk for sudden infant death syndrome (SIDS).  Nutrients in breast milk are better able to meet your baby's needs compared to infant formula.  Breast milk improves your baby's brain development. For you  Breastfeeding helps to create a very special bond between you and your baby.  Breastfeeding is convenient. Breast milk costs nothing and is always available at the correct temperature.  Breastfeeding helps to burn calories. It helps you to lose the weight that you gained during pregnancy.  Breastfeeding makes your uterus return faster to its size before pregnancy. It also slows bleeding (lochia) after you give birth.  Breastfeeding helps to lower your risk of developing type 2 diabetes, osteoporosis, rheumatoid  arthritis, cardiovascular disease, and breast, ovarian, uterine, and endometrial cancer later in life. Breastfeeding basics Starting breastfeeding  Find a comfortable place to sit or lie down, with your neck and back well-supported.  Place a pillow or a rolled-up blanket under your baby to bring him or her to the level of your breast (if you are seated). Nursing pillows are specially designed to help support your arms and your baby while you breastfeed.  Make sure that your baby's tummy (abdomen) is facing your abdomen.  Gently massage your breast. With your fingertips, massage from the outer edges of your breast inward toward the nipple. This encourages milk flow. If your milk flows slowly, you may need to continue this action during the feeding.  Support your breast with 4 fingers underneath and your thumb above your nipple (make the letter "C" with your hand). Make sure your fingers are well away from your nipple and your baby's mouth.  Stroke your baby's lips gently with your finger or nipple.  When your baby's mouth is open wide enough, quickly bring your baby to your breast, placing your entire nipple and as much of the areola as possible into your baby's mouth. The areola is the colored area around your nipple. ? More areola should be visible above your baby's upper lip than below the lower lip. ? Your baby's lips should be opened and extended outward (flanged) to ensure an adequate, comfortable latch. ? Your baby's tongue should be between his or her lower gum and your breast.  Make sure that your baby's mouth is correctly positioned around your nipple (latched). Your baby's lips should create a seal on your breast and be turned out (everted).  It is common for your baby to suck about 2-3 minutes in order to start the flow of breast milk. Latching Teaching your baby how to latch onto your breast properly is very important. An improper latch can cause nipple pain, decreased milk  supply, and poor weight gain in your baby. Also, if your baby is not latched onto your nipple properly, he or she may swallow some air during feeding. This can make your baby fussy. Burping your baby when you switch breasts during the feeding can help to get rid   of the air. However, teaching your baby to latch on properly is still the best way to prevent fussiness from swallowing air while breastfeeding. Signs that your baby has successfully latched onto your nipple  Silent tugging or silent sucking, without causing you pain. Infant's lips should be extended outward (flanged).  Swallowing heard between every 3-4 sucks once your milk has started to flow (after your let-down milk reflex occurs).  Muscle movement above and in front of his or her ears while sucking. Signs that your baby has not successfully latched onto your nipple  Sucking sounds or smacking sounds from your baby while breastfeeding.  Nipple pain. If you think your baby has not latched on correctly, slip your finger into the corner of your baby's mouth to break the suction and place it between your baby's gums. Attempt to start breastfeeding again. Signs of successful breastfeeding Signs from your baby  Your baby will gradually decrease the number of sucks or will completely stop sucking.  Your baby will fall asleep.  Your baby's body will relax.  Your baby will retain a small amount of milk in his or her mouth.  Your baby will let go of your breast by himself or herself. Signs from you  Breasts that have increased in firmness, weight, and size 1-3 hours after feeding.  Breasts that are softer immediately after breastfeeding.  Increased milk volume, as well as a change in milk consistency and color by the fifth day of breastfeeding.  Nipples that are not sore, cracked, or bleeding. Signs that your baby is getting enough milk  Wetting at least 1-2 diapers during the first 24 hours after birth.  Wetting at least 5-6  diapers every 24 hours for the first week after birth. The urine should be clear or pale yellow by the age of 5 days.  Wetting 6-8 diapers every 24 hours as your baby continues to grow and develop.  At least 3 stools in a 24-hour period by the age of 5 days. The stool should be soft and yellow.  At least 3 stools in a 24-hour period by the age of 7 days. The stool should be seedy and yellow.  No loss of weight greater than 10% of birth weight during the first 3 days of life.  Average weight gain of 4-7 oz (113-198 g) per week after the age of 4 days.  Consistent daily weight gain by the age of 5 days, without weight loss after the age of 2 weeks. After a feeding, your baby may spit up a small amount of milk. This is normal. Breastfeeding frequency and duration Frequent feeding will help you make more milk and can prevent sore nipples and extremely full breasts (breast engorgement). Breastfeed when you feel the need to reduce the fullness of your breasts or when your baby shows signs of hunger. This is called "breastfeeding on demand." Signs that your baby is hungry include:  Increased alertness, activity, or restlessness.  Movement of the head from side to side.  Opening of the mouth when the corner of the mouth or cheek is stroked (rooting).  Increased sucking sounds, smacking lips, cooing, sighing, or squeaking.  Hand-to-mouth movements and sucking on fingers or hands.  Fussing or crying. Avoid introducing a pacifier to your baby in the first 4-6 weeks after your baby is born. After this time, you may choose to use a pacifier. Research has shown that pacifier use during the first year of a baby's life decreases the risk of   sudden infant death syndrome (SIDS). Allow your baby to feed on each breast as long as he or she wants. When your baby unlatches or falls asleep while feeding from the first breast, offer the second breast. Because newborns are often sleepy in the first few weeks of  life, you may need to awaken your baby to get him or her to feed. Breastfeeding times will vary from baby to baby. However, the following rules can serve as a guide to help you make sure that your baby is properly fed:  Newborns (babies 4 weeks of age or younger) may breastfeed every 1-3 hours.  Newborns should not go without breastfeeding for longer than 3 hours during the day or 5 hours during the night.  You should breastfeed your baby a minimum of 8 times in a 24-hour period. Breast milk pumping Pumping and storing breast milk allows you to make sure that your baby is exclusively fed your breast milk, even at times when you are unable to breastfeed. This is especially important if you go back to work while you are still breastfeeding, or if you are not able to be present during feedings. Your lactation consultant can help you find a method of pumping that works best for you and give you guidelines about how long it is safe to store breast milk.      Caring for your breasts while you breastfeed Nipples can become dry, cracked, and sore while breastfeeding. The following recommendations can help keep your breasts moisturized and healthy:  Avoid using soap on your nipples.  Wear a supportive bra designed especially for nursing. Avoid wearing underwire-style bras or extremely tight bras (sports bras).  Air-dry your nipples for 3-4 minutes after each feeding.  Use only cotton bra pads to absorb leaked breast milk. Leaking of breast milk between feedings is normal.  Use lanolin on your nipples after breastfeeding. Lanolin helps to maintain your skin's normal moisture barrier. Pure lanolin is not harmful (not toxic) to your baby. You may also hand express a few drops of breast milk and gently massage that milk into your nipples and allow the milk to air-dry. In the first few weeks after giving birth, some women experience breast engorgement. Engorgement can make your breasts feel heavy, warm,  and tender to the touch. Engorgement peaks within 3-5 days after you give birth. The following recommendations can help to ease engorgement:  Completely empty your breasts while breastfeeding or pumping. You may want to start by applying warm, moist heat (in the shower or with warm, water-soaked hand towels) just before feeding or pumping. This increases circulation and helps the milk flow. If your baby does not completely empty your breasts while breastfeeding, pump any extra milk after he or she is finished.  Apply ice packs to your breasts immediately after breastfeeding or pumping, unless this is too uncomfortable for you. To do this: ? Put ice in a plastic bag. ? Place a towel between your skin and the bag. ? Leave the ice on for 20 minutes, 2-3 times a day.  Make sure that your baby is latched on and positioned properly while breastfeeding. If engorgement persists after 48 hours of following these recommendations, contact your health care provider or a lactation consultant. Overall health care recommendations while breastfeeding  Eat 3 healthy meals and 3 snacks every day. Well-nourished mothers who are breastfeeding need an additional 450-500 calories a day. You can meet this requirement by increasing the amount of a balanced diet that   you eat.  Drink enough water to keep your urine pale yellow or clear.  Rest often, relax, and continue to take your prenatal vitamins to prevent fatigue, stress, and low vitamin and mineral levels in your body (nutrient deficiencies).  Do not use any products that contain nicotine or tobacco, such as cigarettes and e-cigarettes. Your baby may be harmed by chemicals from cigarettes that pass into breast milk and exposure to secondhand smoke. If you need help quitting, ask your health care provider.  Avoid alcohol.  Do not use illegal drugs or marijuana.  Talk with your health care provider before taking any medicines. These include over-the-counter and  prescription medicines as well as vitamins and herbal supplements. Some medicines that may be harmful to your baby can pass through breast milk.  It is possible to become pregnant while breastfeeding. If birth control is desired, ask your health care provider about options that will be safe while breastfeeding your baby. Where to find more information: [employer]: www.llli.org Contact a health care provider if:  You feel like you want to stop breastfeeding or have become frustrated with breastfeeding.  Your nipples are cracked or bleeding.  Your breasts are red, tender, or warm.  You have: ? Painful breasts or nipples. ? A swollen area on either breast. ? A fever or chills. ? Nausea or vomiting. ? Drainage other than breast milk from your nipples.  Your breasts do not become full before feedings by the fifth day after you give birth.  You feel sad and depressed.  Your baby is: ? Too sleepy to eat well. ? Having trouble sleeping. ? More than 1 week old and wetting fewer than 6 diapers in a 24-hour period. ? Not gaining weight by 5 days of age.  Your baby has fewer than 3 stools in a 24-hour period.  Your baby's skin or the white parts of his or her eyes become yellow. Get help right away if:  Your baby is overly tired (lethargic) and does not want to wake up and feed.  Your baby develops an unexplained fever. Summary  Breastfeeding offers many health benefits for infant and mothers.  Try to breastfeed your infant when he or she shows early signs of hunger.  Gently tickle or stroke your baby's lips with your finger or nipple to allow the baby to open his or her mouth. Bring the baby to your breast. Make sure that much of the areola is in your baby's mouth. Offer one side and burp the baby before you offer the other side.  Talk with your health care provider or lactation consultant if you have questions or you face problems as you breastfeed. This  information is not intended to replace advice given to you by your health care provider. Make sure you discuss any questions you have with your health care provider. Document Revised: 09/01/2017 Document Reviewed: 07/09/2016 Elsevier Patient Education  2021 Elsevier Inc.  
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
## 2020-08-21 LAB — CBC/D/PLT+RPR+RH+ABO+RUB AB...
Antibody Screen: NEGATIVE
Basophils Absolute: 0 10*3/uL (ref 0.0–0.2)
Basos: 1 %
EOS (ABSOLUTE): 0.2 10*3/uL (ref 0.0–0.4)
Eos: 2 %
HCV Ab: <0.1 s/co ratio (ref 0.0–0.9)
HIV Screen 4th Generation wRfx: NONREACTIVE
Hematocrit: 36.3 % (ref 34.0–46.6)
Hemoglobin: 12.2 g/dL (ref 11.1–15.9)
Hepatitis B Surface Ag: NEGATIVE
Immature Grans (Abs): 0 10*3/uL (ref 0.0–0.1)
Immature Granulocytes: 0 %
Lymphocytes Absolute: 1.2 10*3/uL (ref 0.7–3.1)
Lymphs: 15 %
MCH: 30.8 pg (ref 26.6–33.0)
MCHC: 33.6 g/dL (ref 31.5–35.7)
MCV: 92 fL (ref 79–97)
Monocytes Absolute: 0.4 10*3/uL (ref 0.1–0.9)
Monocytes: 5 %
Neutrophils Absolute: 6.5 10*3/uL (ref 1.4–7.0)
Neutrophils: 77 %
Platelets: 202 10*3/uL (ref 150–450)
RBC: 3.96 x10E6/uL (ref 3.77–5.28)
RDW: 12.2 % (ref 11.7–15.4)
RPR Ser Ql: NONREACTIVE
Rh Factor: POSITIVE
Rubella Antibodies, IGG: 3.68 index (ref >0.99)
WBC: 8.3 10*3/uL (ref 3.4–10.8)

## 2020-08-21 LAB — HEMOGLOBIN A1C
Est. average glucose Bld gHb Est-mCnc: 100 mg/dL
Hgb A1c MFr Bld: 5.1 % (ref 4.8–5.6)

## 2020-08-21 LAB — HCV INTERPRETATION

## 2020-08-22 LAB — URINE CULTURE, OB REFLEX

## 2020-08-22 LAB — CULTURE, OB URINE

## 2020-09-16 ENCOUNTER — Other Ambulatory Visit: Payer: Self-pay

## 2020-09-16 ENCOUNTER — Ambulatory Visit: Payer: Medicaid Other | Attending: Family Medicine

## 2020-09-16 DIAGNOSIS — Z3480 Encounter for supervision of other normal pregnancy, unspecified trimester: Secondary | ICD-10-CM | POA: Diagnosis not present

## 2020-09-17 ENCOUNTER — Other Ambulatory Visit: Payer: Self-pay

## 2020-09-17 ENCOUNTER — Ambulatory Visit (INDEPENDENT_AMBULATORY_CARE_PROVIDER_SITE_OTHER): Payer: Medicaid Other | Admitting: Obstetrics and Gynecology

## 2020-09-17 VITALS — BP 95/70 | HR 92 | Wt 115.5 lb

## 2020-09-17 DIAGNOSIS — Z8751 Personal history of pre-term labor: Secondary | ICD-10-CM

## 2020-09-17 DIAGNOSIS — Z3A19 19 weeks gestation of pregnancy: Secondary | ICD-10-CM

## 2020-09-17 DIAGNOSIS — Z3480 Encounter for supervision of other normal pregnancy, unspecified trimester: Secondary | ICD-10-CM | POA: Diagnosis not present

## 2020-09-17 DIAGNOSIS — O219 Vomiting of pregnancy, unspecified: Secondary | ICD-10-CM

## 2020-09-17 DIAGNOSIS — O0990 Supervision of high risk pregnancy, unspecified, unspecified trimester: Secondary | ICD-10-CM

## 2020-09-17 MED ORDER — DOXYLAMINE-PYRIDOXINE 10-10 MG PO TBEC: DELAYED_RELEASE_TABLET | ORAL | 0 refills | Status: DC

## 2020-09-17 NOTE — Patient Instructions (Signed)
Call [facility] regarding your blood pressure cuff at [phone]  Call [employer] at [phone] to ask about your babyscrpts app

## 2020-09-17 NOTE — Progress Notes (Signed)
Has not picked up blood pressure cuff yet, will call [facility] or go by there today. Having issues with [employer] app, given number to call. Elzy Tomasello,RN
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
## 2020-09-25 ENCOUNTER — Encounter: Payer: Self-pay | Admitting: *Deleted

## 2020-10-15 ENCOUNTER — Ambulatory Visit (INDEPENDENT_AMBULATORY_CARE_PROVIDER_SITE_OTHER): Payer: Medicaid Other | Admitting: Family Medicine

## 2020-10-15 ENCOUNTER — Encounter: Payer: Self-pay | Admitting: Family Medicine

## 2020-10-15 VITALS — BP 106/71 | HR 105 | Wt 119.7 lb

## 2020-10-15 DIAGNOSIS — O0990 Supervision of high risk pregnancy, unspecified, unspecified trimester: Secondary | ICD-10-CM

## 2020-10-15 DIAGNOSIS — Z8751 Personal history of pre-term labor: Secondary | ICD-10-CM

## 2020-10-15 NOTE — Progress Notes (Signed)
Medicaid Home Form completed. 

## 2020-10-15 NOTE — Patient Instructions (Signed)
 Contraception Choices Contraception, also called birth control, refers to methods or devices that prevent pregnancy. Hormonal methods Contraceptive implant A contraceptive implant is a thin, plastic tube that contains a hormone that prevents pregnancy. It is different from an intrauterine device (IUD). It is inserted into the upper part of the arm by a health care provider. Implants can be effective for up to 3 years. Progestin-only injections Progestin-only injections are injections of progestin, a synthetic form of the hormone progesterone. They are given every 3 months by a health care provider. Birth control pills Birth control pills are pills that contain hormones that prevent pregnancy. They must be taken once a day, preferably at the same time each day. A prescription is needed to use this method of contraception. Birth control patch The birth control patch contains hormones that prevent pregnancy. It is placed on the skin and must be changed once a week for three weeks and removed on the fourth week. A prescription is needed to use this method of contraception. Vaginal ring A vaginal ring contains hormones that prevent pregnancy. It is placed in the vagina for three weeks and removed on the fourth week. After that, the process is repeated with a new ring. A prescription is needed to use this method of contraception. Emergency contraceptive Emergency contraceptives prevent pregnancy after unprotected sex. They come in pill form and can be taken up to 5 days after sex. They work best the sooner they are taken after having sex. Most emergency contraceptives are available without a prescription. This method should not be used as your only form of birth control.   Barrier methods Female condom A female condom is a thin sheath that is worn over the penis during sex. Condoms keep sperm from going inside a woman's body. They can be used with a sperm-killing substance (spermicide) to increase their  effectiveness. They should be thrown away after one use. Female condom A female condom is a soft, loose-fitting sheath that is put into the vagina before sex. The condom keeps sperm from going inside a woman's body. They should be thrown away after one use. Diaphragm A diaphragm is a soft, dome-shaped barrier. It is inserted into the vagina before sex, along with a spermicide. The diaphragm blocks sperm from entering the uterus, and the spermicide kills sperm. A diaphragm should be left in the vagina for 6-8 hours after sex and removed within 24 hours. A diaphragm is prescribed and fitted by a health care provider. A diaphragm should be replaced every 1-2 years, after giving birth, after gaining more than 15 lb (6.8 kg), and after pelvic surgery. Cervical cap A cervical cap is a round, soft latex or plastic cup that fits over the cervix. It is inserted into the vagina before sex, along with spermicide. It blocks sperm from entering the uterus. The cap should be left in place for 6-8 hours after sex and removed within 48 hours. A cervical cap must be prescribed and fitted by a health care provider. It should be replaced every 2 years. Sponge A sponge is a soft, circular piece of polyurethane foam with spermicide in it. The sponge helps block sperm from entering the uterus, and the spermicide kills sperm. To use it, you make it wet and then insert it into the vagina. It should be inserted before sex, left in for at least 6 hours after sex, and removed and thrown away within 30 hours. Spermicides Spermicides are chemicals that kill or block sperm from entering the   cervix and uterus. They can come as a cream, jelly, suppository, foam, or tablet. A spermicide should be inserted into the vagina with an applicator at least 10-15 minutes before sex to allow time for it to work. The process must be repeated every time you have sex. Spermicides do not require a prescription.   Intrauterine  contraception Intrauterine device (IUD) An IUD is a T-shaped device that is put in a woman's uterus. There are two types:  Hormone IUD.This type contains progestin, a synthetic form of the hormone progesterone. This type can stay in place for 3-5 years.  Copper IUD.This type is wrapped in copper wire. It can stay in place for 10 years. Permanent methods of contraception Female tubal ligation In this method, a woman's fallopian tubes are sealed, tied, or blocked during surgery to prevent eggs from traveling to the uterus. Hysteroscopic sterilization In this method, a small, flexible insert is placed into each fallopian tube. The inserts cause scar tissue to form in the fallopian tubes and block them, so sperm cannot reach an egg. The procedure takes about 3 months to be effective. Another form of birth control must be used during those 3 months. Female sterilization This is a procedure to tie off the tubes that carry sperm (vasectomy). After the procedure, the man can still ejaculate fluid (semen). Another form of birth control must be used for 3 months after the procedure. Natural planning methods Natural family planning In this method, a couple does not have sex on days when the woman could become pregnant. Calendar method In this method, the woman keeps track of the length of each menstrual cycle, identifies the days when pregnancy can happen, and does not have sex on those days. Ovulation method In this method, a couple avoids sex during ovulation. Symptothermal method This method involves not having sex during ovulation. The woman typically checks for ovulation by watching changes in her temperature and in the consistency of cervical mucus. Post-ovulation method In this method, a couple waits to have sex until after ovulation. Where to find more information  Centers for Disease Control and Prevention: www.cdc.gov Summary  Contraception, also called birth control, refers to methods or  devices that prevent pregnancy.  Hormonal methods of contraception include implants, injections, pills, patches, vaginal rings, and emergency contraceptives.  Barrier methods of contraception can include female condoms, female condoms, diaphragms, cervical caps, sponges, and spermicides.  There are two types of IUDs (intrauterine devices). An IUD can be put in a woman's uterus to prevent pregnancy for 3-5 years.  Permanent sterilization can be done through a procedure for males and females. Natural family planning methods involve nothaving sex on days when the woman could become pregnant. This information is not intended to replace advice given to you by your health care provider. Make sure you discuss any questions you have with your health care provider. Document Revised: 11/12/2019 Document Reviewed: 11/12/2019 Elsevier Patient Education  2021 [employer] Inc.   Breastfeeding  Choosing to breastfeed is one of the best decisions you can make for yourself and your baby. A change in hormones during pregnancy causes your breasts to make breast milk in your milk-producing glands. Hormones prevent breast milk from being released before your baby is born. They also prompt milk flow after birth. Once breastfeeding has begun, thoughts of your baby, as well as his or her sucking or crying, can stimulate the release of milk from your milk-producing glands. Benefits of breastfeeding Research shows that breastfeeding offers many health benefits   for infants and mothers. It also offers a cost-free and convenient way to feed your baby. For your baby  Your first milk (colostrum) helps your baby's digestive system to function better.  Special cells in your milk (antibodies) help your baby to fight off infections.  Breastfed babies are less likely to develop asthma, allergies, obesity, or type 2 diabetes. They are also at lower risk for sudden infant death syndrome (SIDS).  Nutrients in breast milk are better  able to meet your baby's needs compared to infant formula.  Breast milk improves your baby's brain development. For you  Breastfeeding helps to create a very special bond between you and your baby.  Breastfeeding is convenient. Breast milk costs nothing and is always available at the correct temperature.  Breastfeeding helps to burn calories. It helps you to lose the weight that you gained during pregnancy.  Breastfeeding makes your uterus return faster to its size before pregnancy. It also slows bleeding (lochia) after you give birth.  Breastfeeding helps to lower your risk of developing type 2 diabetes, osteoporosis, rheumatoid arthritis, cardiovascular disease, and breast, ovarian, uterine, and endometrial cancer later in life. Breastfeeding basics Starting breastfeeding  Find a comfortable place to sit or lie down, with your neck and back well-supported.  Place a pillow or a rolled-up blanket under your baby to bring him or her to the level of your breast (if you are seated). Nursing pillows are specially designed to help support your arms and your baby while you breastfeed.  Make sure that your baby's tummy (abdomen) is facing your abdomen.  Gently massage your breast. With your fingertips, massage from the outer edges of your breast inward toward the nipple. This encourages milk flow. If your milk flows slowly, you may need to continue this action during the feeding.  Support your breast with 4 fingers underneath and your thumb above your nipple (make the letter "C" with your hand). Make sure your fingers are well away from your nipple and your baby's mouth.  Stroke your baby's lips gently with your finger or nipple.  When your baby's mouth is open wide enough, quickly bring your baby to your breast, placing your entire nipple and as much of the areola as possible into your baby's mouth. The areola is the colored area around your nipple. ? More areola should be visible above your  baby's upper lip than below the lower lip. ? Your baby's lips should be opened and extended outward (flanged) to ensure an adequate, comfortable latch. ? Your baby's tongue should be between his or her lower gum and your breast.  Make sure that your baby's mouth is correctly positioned around your nipple (latched). Your baby's lips should create a seal on your breast and be turned out (everted).  It is common for your baby to suck about 2-3 minutes in order to start the flow of breast milk. Latching Teaching your baby how to latch onto your breast properly is very important. An improper latch can cause nipple pain, decreased milk supply, and poor weight gain in your baby. Also, if your baby is not latched onto your nipple properly, he or she may swallow some air during feeding. This can make your baby fussy. Burping your baby when you switch breasts during the feeding can help to get rid of the air. However, teaching your baby to latch on properly is still the best way to prevent fussiness from swallowing air while breastfeeding. Signs that your baby has successfully latched onto   your nipple  Silent tugging or silent sucking, without causing you pain. Infant's lips should be extended outward (flanged).  Swallowing heard between every 3-4 sucks once your milk has started to flow (after your let-down milk reflex occurs).  Muscle movement above and in front of his or her ears while sucking. Signs that your baby has not successfully latched onto your nipple  Sucking sounds or smacking sounds from your baby while breastfeeding.  Nipple pain. If you think your baby has not latched on correctly, slip your finger into the corner of your baby's mouth to break the suction and place it between your baby's gums. Attempt to start breastfeeding again. Signs of successful breastfeeding Signs from your baby  Your baby will gradually decrease the number of sucks or will completely stop sucking.  Your baby  will fall asleep.  Your baby's body will relax.  Your baby will retain a small amount of milk in his or her mouth.  Your baby will let go of your breast by himself or herself. Signs from you  Breasts that have increased in firmness, weight, and size 1-3 hours after feeding.  Breasts that are softer immediately after breastfeeding.  Increased milk volume, as well as a change in milk consistency and color by the fifth day of breastfeeding.  Nipples that are not sore, cracked, or bleeding. Signs that your baby is getting enough milk  Wetting at least 1-2 diapers during the first 24 hours after birth.  Wetting at least 5-6 diapers every 24 hours for the first week after birth. The urine should be clear or pale yellow by the age of 5 days.  Wetting 6-8 diapers every 24 hours as your baby continues to grow and develop.  At least 3 stools in a 24-hour period by the age of 5 days. The stool should be soft and yellow.  At least 3 stools in a 24-hour period by the age of 7 days. The stool should be seedy and yellow.  No loss of weight greater than 10% of birth weight during the first 3 days of life.  Average weight gain of 4-7 oz (113-198 g) per week after the age of 4 days.  Consistent daily weight gain by the age of 5 days, without weight loss after the age of 2 weeks. After a feeding, your baby may spit up a small amount of milk. This is normal. Breastfeeding frequency and duration Frequent feeding will help you make more milk and can prevent sore nipples and extremely full breasts (breast engorgement). Breastfeed when you feel the need to reduce the fullness of your breasts or when your baby shows signs of hunger. This is called "breastfeeding on demand." Signs that your baby is hungry include:  Increased alertness, activity, or restlessness.  Movement of the head from side to side.  Opening of the mouth when the corner of the mouth or cheek is stroked (rooting).  Increased  sucking sounds, smacking lips, cooing, sighing, or squeaking.  Hand-to-mouth movements and sucking on fingers or hands.  Fussing or crying. Avoid introducing a pacifier to your baby in the first 4-6 weeks after your baby is born. After this time, you may choose to use a pacifier. Research has shown that pacifier use during the first year of a baby's life decreases the risk of sudden infant death syndrome (SIDS). Allow your baby to feed on each breast as long as he or she wants. When your baby unlatches or falls asleep while feeding from the   first breast, offer the second breast. Because newborns are often sleepy in the first few weeks of life, you may need to awaken your baby to get him or her to feed. Breastfeeding times will vary from baby to baby. However, the following rules can serve as a guide to help you make sure that your baby is properly fed:  Newborns (babies 4 weeks of age or younger) may breastfeed every 1-3 hours.  Newborns should not go without breastfeeding for longer than 3 hours during the day or 5 hours during the night.  You should breastfeed your baby a minimum of 8 times in a 24-hour period. Breast milk pumping Pumping and storing breast milk allows you to make sure that your baby is exclusively fed your breast milk, even at times when you are unable to breastfeed. This is especially important if you go back to work while you are still breastfeeding, or if you are not able to be present during feedings. Your lactation consultant can help you find a method of pumping that works best for you and give you guidelines about how long it is safe to store breast milk.      Caring for your breasts while you breastfeed Nipples can become dry, cracked, and sore while breastfeeding. The following recommendations can help keep your breasts moisturized and healthy:  Avoid using soap on your nipples.  Wear a supportive bra designed especially for nursing. Avoid wearing underwire-style  bras or extremely tight bras (sports bras).  Air-dry your nipples for 3-4 minutes after each feeding.  Use only cotton bra pads to absorb leaked breast milk. Leaking of breast milk between feedings is normal.  Use lanolin on your nipples after breastfeeding. Lanolin helps to maintain your skin's normal moisture barrier. Pure lanolin is not harmful (not toxic) to your baby. You may also hand express a few drops of breast milk and gently massage that milk into your nipples and allow the milk to air-dry. In the first few weeks after giving birth, some women experience breast engorgement. Engorgement can make your breasts feel heavy, warm, and tender to the touch. Engorgement peaks within 3-5 days after you give birth. The following recommendations can help to ease engorgement:  Completely empty your breasts while breastfeeding or pumping. You may want to start by applying warm, moist heat (in the shower or with warm, water-soaked hand towels) just before feeding or pumping. This increases circulation and helps the milk flow. If your baby does not completely empty your breasts while breastfeeding, pump any extra milk after he or she is finished.  Apply ice packs to your breasts immediately after breastfeeding or pumping, unless this is too uncomfortable for you. To do this: ? Put ice in a plastic bag. ? Place a towel between your skin and the bag. ? Leave the ice on for 20 minutes, 2-3 times a day.  Make sure that your baby is latched on and positioned properly while breastfeeding. If engorgement persists after 48 hours of following these recommendations, contact your health care provider or a lactation consultant. Overall health care recommendations while breastfeeding  Eat 3 healthy meals and 3 snacks every day. Well-nourished mothers who are breastfeeding need an additional 450-500 calories a day. You can meet this requirement by increasing the amount of a balanced diet that you eat.  Drink  enough water to keep your urine pale yellow or clear.  Rest often, relax, and continue to take your prenatal vitamins to prevent fatigue, stress, and low   vitamin and mineral levels in your body (nutrient deficiencies).  Do not use any products that contain nicotine or tobacco, such as cigarettes and e-cigarettes. Your baby may be harmed by chemicals from cigarettes that pass into breast milk and exposure to secondhand smoke. If you need help quitting, ask your health care provider.  Avoid alcohol.  Do not use illegal drugs or marijuana.  Talk with your health care provider before taking any medicines. These include over-the-counter and prescription medicines as well as vitamins and herbal supplements. Some medicines that may be harmful to your baby can pass through breast milk.  It is possible to become pregnant while breastfeeding. If birth control is desired, ask your health care provider about options that will be safe while breastfeeding your baby. Where to find more information: [employer]: www.llli.org Contact a health care provider if:  You feel like you want to stop breastfeeding or have become frustrated with breastfeeding.  Your nipples are cracked or bleeding.  Your breasts are red, tender, or warm.  You have: ? Painful breasts or nipples. ? A swollen area on either breast. ? A fever or chills. ? Nausea or vomiting. ? Drainage other than breast milk from your nipples.  Your breasts do not become full before feedings by the fifth day after you give birth.  You feel sad and depressed.  Your baby is: ? Too sleepy to eat well. ? Having trouble sleeping. ? More than 1 week old and wetting fewer than 6 diapers in a 24-hour period. ? Not gaining weight by 5 days of age.  Your baby has fewer than 3 stools in a 24-hour period.  Your baby's skin or the white parts of his or her eyes become yellow. Get help right away if:  Your baby is overly tired  (lethargic) and does not want to wake up and feed.  Your baby develops an unexplained fever. Summary  Breastfeeding offers many health benefits for infant and mothers.  Try to breastfeed your infant when he or she shows early signs of hunger.  Gently tickle or stroke your baby's lips with your finger or nipple to allow the baby to open his or her mouth. Bring the baby to your breast. Make sure that much of the areola is in your baby's mouth. Offer one side and burp the baby before you offer the other side.  Talk with your health care provider or lactation consultant if you have questions or you face problems as you breastfeed. This information is not intended to replace advice given to you by your health care provider. Make sure you discuss any questions you have with your health care provider. Document Revised: 09/01/2017 Document Reviewed: 07/09/2016 Elsevier Patient Education  2021 Elsevier Inc.  
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
## 2020-11-12 ENCOUNTER — Ambulatory Visit (INDEPENDENT_AMBULATORY_CARE_PROVIDER_SITE_OTHER): Payer: Medicaid Other | Admitting: Family Medicine

## 2020-11-12 VITALS — BP 101/74 | HR 86 | Wt 122.4 lb

## 2020-11-12 DIAGNOSIS — O0990 Supervision of high risk pregnancy, unspecified, unspecified trimester: Secondary | ICD-10-CM

## 2020-11-12 DIAGNOSIS — O365920 Maternal care for other known or suspected poor fetal growth, second trimester, not applicable or unspecified: Secondary | ICD-10-CM

## 2020-11-12 NOTE — Patient Instructions (Signed)
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
## 2020-11-20 ENCOUNTER — Other Ambulatory Visit: Payer: Self-pay | Admitting: *Deleted

## 2020-11-20 DIAGNOSIS — O0990 Supervision of high risk pregnancy, unspecified, unspecified trimester: Secondary | ICD-10-CM

## 2020-11-21 ENCOUNTER — Encounter: Payer: Self-pay | Admitting: Obstetrics and Gynecology

## 2020-11-21 ENCOUNTER — Other Ambulatory Visit: Payer: Self-pay

## 2020-11-21 ENCOUNTER — Other Ambulatory Visit: Payer: Medicaid Other

## 2020-11-21 ENCOUNTER — Ambulatory Visit (INDEPENDENT_AMBULATORY_CARE_PROVIDER_SITE_OTHER): Payer: Medicaid Other | Admitting: Obstetrics and Gynecology

## 2020-11-21 VITALS — BP 114/75 | HR 79 | Wt 124.4 lb

## 2020-11-21 DIAGNOSIS — O0990 Supervision of high risk pregnancy, unspecified, unspecified trimester: Secondary | ICD-10-CM | POA: Diagnosis not present

## 2020-11-21 DIAGNOSIS — Z23 Encounter for immunization: Secondary | ICD-10-CM | POA: Diagnosis not present

## 2020-11-21 DIAGNOSIS — Z3A28 28 weeks gestation of pregnancy: Secondary | ICD-10-CM

## 2020-11-21 DIAGNOSIS — O26842 Uterine size-date discrepancy, second trimester: Secondary | ICD-10-CM

## 2020-11-21 DIAGNOSIS — Z8751 Personal history of pre-term labor: Secondary | ICD-10-CM

## 2020-11-21 NOTE — Progress Notes (Signed)
    PRENATAL VISIT NOTE  Subjective:  Barbara Mckinney is a 25 y.o. G2P0101 at [redacted]w[redacted]d being seen today for ongoing prenatal care.  She is currently monitored for the following issues for this high-risk pregnancy and has S/P exploratory laparotomy; Generalized anxiety disorder; History of UTI; Difficulty urinating; Supervision of high risk pregnancy, antepartum; History of preterm delivery; and Fundal height low for dates in second trimester on their problem list.  Patient reports no complaints.  Contractions: Irritability. Vag. Bleeding: None.  Movement: Present. Denies leaking of fluid.   The following portions of the patient's history were reviewed and updated as appropriate: allergies, current medications, past family history, past medical history, past social history, past surgical history and problem list.   Objective:   Vitals:   11/21/20 0859  BP: 114/75  Pulse: 79  Weight: 124 lb 6.4 oz (56.4 kg)    Fetal Status: Fetal Heart Rate (bpm): 136 Fundal Height: 27 cm Movement: Present     General:  Alert, oriented and cooperative. Patient is in no acute distress.  Skin: Skin is warm and dry. No rash noted.   Cardiovascular: Normal heart rate noted  Respiratory: Normal respiratory effort, no problems with respiration noted  Abdomen: Soft, gravid, appropriate for gestational age.  Pain/Pressure: Absent     Pelvic: Cervical exam deferred        Extremities: Normal range of motion.  Edema: None  Mental Status: Normal mood and affect. Normal behavior. Normal judgment and thought content.   Assessment and Plan:  Pregnancy: G2P0101 at [redacted]w[redacted]d 1. Supervision of high risk pregnancy, antepartum Has early satiety, no morning sickness or GERD s/s. She did trial of presumptive PPI before and no help. Recommend grazing and small meals throughout the day. 8lbs TWG and 2lbs since 5/25 visit  2. [redacted] weeks gestation of pregnancy  3. Fundal height low for dates in second trimester Normal today. Has  6/10 u/s for growth  4. History of preterm delivery At 36wks. Declined 17p  Preterm labor symptoms and general obstetric precautions including but not limited to vaginal bleeding, contractions, leaking of fluid and fetal movement were reviewed in detail with the patient. Please refer to After Visit Summary for other counseling recommendations.   Return in about 2 weeks (around 12/05/2020) for low risk ob, md or app, in person.  Future Appointments  Date Time Provider Department Center  11/21/2020  9:40 AM [place] Bing, MD [facility]  11/28/2020  3:45 PM [facility] NURSE [facility]  11/28/2020  4:00 PM [facility] US1 [facility]  12/01/2020  9:00 AM [name], NP [facility] None  02/18/2021  3:15 PM McKenzie, Mardene Celeste, MD [facility] None    [place] Bing, MD

## 2020-11-22 LAB — CBC
Hematocrit: 36.1 % (ref 34.0–46.6)
Hemoglobin: 11.9 g/dL (ref 11.1–15.9)
MCH: 30.7 pg (ref 26.6–33.0)
MCHC: 33 g/dL (ref 31.5–35.7)
MCV: 93 fL (ref 79–97)
Platelets: 158 10*3/uL (ref 150–450)
RBC: 3.87 x10E6/uL (ref 3.77–5.28)
RDW: 12.6 % (ref 11.7–15.4)
WBC: 8.9 10*3/uL (ref 3.4–10.8)

## 2020-11-22 LAB — GLUCOSE TOLERANCE, 2 HOURS W/ 1HR
Glucose, 1 hour: 179 mg/dL (ref 65–179)
Glucose, 2 hour: 100 mg/dL (ref 65–152)
Glucose, Fasting: 86 mg/dL (ref 65–91)

## 2020-11-22 LAB — HIV ANTIBODY (ROUTINE TESTING W REFLEX): HIV Screen 4th Generation wRfx: NONREACTIVE

## 2020-11-22 LAB — RPR: RPR Ser Ql: NONREACTIVE

## 2020-11-26 ENCOUNTER — Other Ambulatory Visit: Payer: Medicaid Other

## 2020-11-28 ENCOUNTER — Ambulatory Visit: Payer: Medicaid Other | Attending: Family Medicine

## 2020-11-28 ENCOUNTER — Other Ambulatory Visit: Payer: Self-pay

## 2020-11-28 ENCOUNTER — Ambulatory Visit: Payer: Medicaid Other | Admitting: *Deleted

## 2020-11-28 ENCOUNTER — Encounter: Payer: Self-pay | Admitting: *Deleted

## 2020-11-28 VITALS — BP 111/68 | HR 97

## 2020-11-28 DIAGNOSIS — O26843 Uterine size-date discrepancy, third trimester: Secondary | ICD-10-CM | POA: Diagnosis not present

## 2020-11-28 DIAGNOSIS — O321XX0 Maternal care for breech presentation, not applicable or unspecified: Secondary | ICD-10-CM | POA: Diagnosis not present

## 2020-11-28 DIAGNOSIS — O365920 Maternal care for other known or suspected poor fetal growth, second trimester, not applicable or unspecified: Secondary | ICD-10-CM | POA: Diagnosis not present

## 2020-11-28 DIAGNOSIS — Z362 Encounter for other antenatal screening follow-up: Secondary | ICD-10-CM

## 2020-11-28 DIAGNOSIS — O0990 Supervision of high risk pregnancy, unspecified, unspecified trimester: Secondary | ICD-10-CM | POA: Insufficient documentation

## 2020-11-28 DIAGNOSIS — Z3A29 29 weeks gestation of pregnancy: Secondary | ICD-10-CM | POA: Diagnosis not present

## 2020-11-28 DIAGNOSIS — O09293 Supervision of pregnancy with other poor reproductive or obstetric history, third trimester: Secondary | ICD-10-CM | POA: Diagnosis not present

## 2020-12-01 ENCOUNTER — Ambulatory Visit: Payer: Medicaid Other | Admitting: Nurse Practitioner

## 2020-12-03 ENCOUNTER — Other Ambulatory Visit: Payer: Self-pay | Admitting: Obstetrics and Gynecology

## 2020-12-11 ENCOUNTER — Inpatient Hospital Stay (HOSPITAL_COMMUNITY)
Admission: AD | Admit: 2020-12-11 | Discharge: 2020-12-12 | Disposition: A | Discharge status: Home / Self Care | Payer: Medicaid Other | Attending: Obstetrics & Gynecology | Admitting: Obstetrics & Gynecology

## 2020-12-11 ENCOUNTER — Other Ambulatory Visit: Payer: Self-pay

## 2020-12-11 ENCOUNTER — Encounter (HOSPITAL_COMMUNITY): Payer: Self-pay | Admitting: Obstetrics & Gynecology

## 2020-12-11 DIAGNOSIS — O212 Late vomiting of pregnancy: Secondary | ICD-10-CM | POA: Diagnosis not present

## 2020-12-11 DIAGNOSIS — Z3A31 31 weeks gestation of pregnancy: Secondary | ICD-10-CM | POA: Insufficient documentation

## 2020-12-11 DIAGNOSIS — O4703 False labor before 37 completed weeks of gestation, third trimester: Secondary | ICD-10-CM | POA: Diagnosis not present

## 2020-12-11 DIAGNOSIS — Z79899 Other long term (current) drug therapy: Secondary | ICD-10-CM | POA: Diagnosis not present

## 2020-12-11 DIAGNOSIS — O219 Vomiting of pregnancy, unspecified: Secondary | ICD-10-CM

## 2020-12-11 DIAGNOSIS — Z760 Encounter for issue of repeat prescription: Secondary | ICD-10-CM | POA: Diagnosis not present

## 2020-12-11 DIAGNOSIS — O4700 False labor before 37 completed weeks of gestation, unspecified trimester: Secondary | ICD-10-CM

## 2020-12-11 DIAGNOSIS — O26893 Other specified pregnancy related conditions, third trimester: Secondary | ICD-10-CM | POA: Insufficient documentation

## 2020-12-11 MED ORDER — ONDANSETRON HCL 4 MG/2ML IJ SOLN
4.0000 mg | Freq: Once | INTRAMUSCULAR | Status: AC
  Administered 2020-12-11: 4 mg via INTRAVENOUS
  Filled 2020-12-11: qty 2

## 2020-12-11 MED ORDER — LACTATED RINGERS IV SOLN: Freq: Once | INTRAVENOUS | Status: AC

## 2020-12-11 NOTE — MAU Note (Addendum)
Pt reports nausea, vomiting, and headache since yesterday when she ran out of her nausea and vomiting medicine. Pt state she tried to get the doctor to refill it, but was denied.   Denies vaginal bleeding or LOF.   Reports +FM   Pt reports blurry vision.
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
## 2020-12-12 LAB — BASIC METABOLIC PANEL
Anion gap: 7 (ref 5–15)
BUN: 7 mg/dL (ref 6–20)
CO2: 24 mmol/L (ref 22–32)
Calcium: 8.6 mg/dL — ABNORMAL LOW (ref 8.9–10.3)
Chloride: 105 mmol/L (ref 98–111)
Creatinine, Ser: 0.55 mg/dL (ref 0.44–1.00)
GFR, Estimated: >60 mL/min (ref >60)
Glucose, Bld: 90 mg/dL (ref 70–99)
Potassium: 4 mmol/L (ref 3.5–5.1)
Sodium: 136 mmol/L (ref 135–145)

## 2020-12-12 LAB — URINALYSIS, ROUTINE W REFLEX MICROSCOPIC
Bilirubin Urine: NEGATIVE
Glucose, UA: NEGATIVE mg/dL
Hgb urine dipstick: NEGATIVE
Ketones, ur: NEGATIVE mg/dL
Leukocytes,Ua: NEGATIVE
Nitrite: NEGATIVE
Protein, ur: NEGATIVE mg/dL
Specific Gravity, Urine: 1.021 (ref 1.005–1.030)
pH: 7 (ref 5.0–8.0)

## 2020-12-12 LAB — FETAL FIBRONECTIN: Fetal Fibronectin: NEGATIVE

## 2020-12-12 MED ORDER — DOXYLAMINE-PYRIDOXINE 10-10 MG PO TBEC: 2.0000 | DELAYED_RELEASE_TABLET | Freq: Every evening | ORAL | 3 refills | Status: DC | PRN

## 2020-12-12 MED ORDER — NIFEDIPINE 10 MG PO CAPS
10.0000 mg | ORAL_CAPSULE | ORAL | Status: DC | PRN
  Administered 2020-12-12 (×2): 10 mg via ORAL
  Filled 2020-12-12 (×3): qty 1

## 2020-12-12 MED ORDER — LACTATED RINGERS IV SOLN: Freq: Once | INTRAVENOUS | Status: AC

## 2020-12-12 MED ORDER — TERBUTALINE SULFATE 1 MG/ML IJ SOLN
0.2500 mg | Freq: Once | INTRAMUSCULAR | Status: AC
  Administered 2020-12-12: 0.25 mg via SUBCUTANEOUS
  Filled 2020-12-12: qty 1

## 2020-12-14 ENCOUNTER — Encounter (HOSPITAL_COMMUNITY): Payer: Self-pay | Admitting: Obstetrics & Gynecology

## 2020-12-14 ENCOUNTER — Inpatient Hospital Stay (HOSPITAL_COMMUNITY)
Admission: AD | Admit: 2020-12-14 | Discharge: 2020-12-14 | Disposition: A | Discharge status: Home / Self Care | Payer: Medicaid Other | Attending: Obstetrics & Gynecology | Admitting: Obstetrics & Gynecology

## 2020-12-14 ENCOUNTER — Other Ambulatory Visit: Payer: Self-pay

## 2020-12-14 DIAGNOSIS — Z3A31 31 weeks gestation of pregnancy: Secondary | ICD-10-CM | POA: Diagnosis not present

## 2020-12-14 DIAGNOSIS — O212 Late vomiting of pregnancy: Secondary | ICD-10-CM | POA: Diagnosis not present

## 2020-12-14 DIAGNOSIS — O4703 False labor before 37 completed weeks of gestation, third trimester: Secondary | ICD-10-CM

## 2020-12-14 DIAGNOSIS — R110 Nausea: Secondary | ICD-10-CM | POA: Insufficient documentation

## 2020-12-14 DIAGNOSIS — O26893 Other specified pregnancy related conditions, third trimester: Secondary | ICD-10-CM | POA: Diagnosis not present

## 2020-12-14 DIAGNOSIS — O98819 Other maternal infectious and parasitic diseases complicating pregnancy, unspecified trimester: Secondary | ICD-10-CM

## 2020-12-14 DIAGNOSIS — Z3689 Encounter for other specified antenatal screening: Secondary | ICD-10-CM

## 2020-12-14 LAB — URINALYSIS, ROUTINE W REFLEX MICROSCOPIC
Bilirubin Urine: NEGATIVE
Glucose, UA: NEGATIVE mg/dL
Hgb urine dipstick: NEGATIVE
Ketones, ur: NEGATIVE mg/dL
Nitrite: NEGATIVE
Protein, ur: NEGATIVE mg/dL
Specific Gravity, Urine: 1.005 (ref 1.005–1.030)
pH: 7 (ref 5.0–8.0)

## 2020-12-14 LAB — WET PREP, GENITAL
Clue Cells Wet Prep HPF POC: NONE SEEN
Sperm: NONE SEEN
Trich, Wet Prep: NONE SEEN

## 2020-12-14 MED ORDER — TERCONAZOLE 0.4 % VA CREA: 1.0000 | TOPICAL_CREAM | Freq: Every day | VAGINAL | 0 refills | Status: DC

## 2020-12-14 MED ORDER — ONDANSETRON HCL 4 MG/2ML IJ SOLN
4.0000 mg | Freq: Once | INTRAMUSCULAR | Status: AC
  Administered 2020-12-14: 4 mg via INTRAVENOUS
  Filled 2020-12-14: qty 2

## 2020-12-14 MED ORDER — LACTATED RINGERS IV BOLUS
1000.0000 mL | Freq: Once | INTRAVENOUS | Status: AC
  Administered 2020-12-14: 1000 mL via INTRAVENOUS
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
## 2020-12-14 NOTE — MAU Note (Signed)
Pt reports she ha has some ctx and tightening since yesterday. Pain and tightening has gotten worse. Feels tight all the time had to take a deep breath.  Pt reports she is feeling baby move. Denies any vag bleeding or leaking at this time.

## 2020-12-15 ENCOUNTER — Encounter: Payer: Medicaid Other | Admitting: Student

## 2020-12-15 LAB — GC/CHLAMYDIA PROBE AMP (~~LOC~~) NOT AT ARMC
Chlamydia: NEGATIVE
Comment: NEGATIVE
Comment: NORMAL
Neisseria Gonorrhea: NEGATIVE

## 2020-12-23 ENCOUNTER — Other Ambulatory Visit: Payer: Self-pay

## 2020-12-23 ENCOUNTER — Ambulatory Visit (INDEPENDENT_AMBULATORY_CARE_PROVIDER_SITE_OTHER): Payer: Medicaid Other | Admitting: Student

## 2020-12-23 VITALS — BP 119/76 | HR 99 | Wt 125.0 lb

## 2020-12-23 DIAGNOSIS — Z3A33 33 weeks gestation of pregnancy: Secondary | ICD-10-CM

## 2020-12-23 DIAGNOSIS — O0990 Supervision of high risk pregnancy, unspecified, unspecified trimester: Secondary | ICD-10-CM

## 2020-12-23 NOTE — Progress Notes (Signed)
   PRENATAL VISIT NOTE  Subjective:  Barbara Mckinney is a 25 y.o. G2P0101 at [redacted]w[redacted]d being seen today for ongoing prenatal care.  She is currently monitored for the following issues for this low-risk pregnancy and has S/P exploratory laparotomy; Generalized anxiety disorder; History of UTI; Difficulty urinating; Supervision of high risk pregnancy, antepartum; History of preterm delivery; and Fundal height low for dates in second trimester on their problem list.  Patient reports no complaints. She reports that she is concerned about her belly being too small. Her relatives keep telling her that her belly is too small. Her first baby was born small.  Contractions: Irritability. Vag. Bleeding: None.  Movement: Present. Denies leaking of fluid.   The following portions of the patient's history were reviewed and updated as appropriate: allergies, current medications, past family history, past medical history, past social history, past surgical history and problem list.   Objective:   Vitals:   12/23/20 1551  BP: 119/76  Pulse: 99  Weight: 125 lb (56.7 kg)    Fetal Status: Fetal Heart Rate (bpm): 140 Fundal Height: 32 cm Movement: Present     General:  Alert, oriented and cooperative. Patient is in no acute distress.  Skin: Skin is warm and dry. No rash noted.   Cardiovascular: Normal heart rate noted  Respiratory: Normal respiratory effort, no problems with respiration noted  Abdomen: Soft, gravid, appropriate for gestational age.  Pain/Pressure: Present     Pelvic: Cervical exam deferred        Extremities: Normal range of motion.  Edema: None  Mental Status: Normal mood and affect. Normal behavior. Normal judgment and thought content.   Assessment and Plan:  Pregnancy: G2P0101 at [redacted]w[redacted]d 1. Supervision of high risk pregnancy, antepartum -Reviewed latest [place]; normal growth and FH is normal today; reassurance given -Discussed birth control; she wants to do condoms -Reviewed genetic testing;  all normal -patient still considering circ; will think about it.  Preterm labor symptoms and general obstetric precautions including but not limited to vaginal bleeding, contractions, leaking of fluid and fetal movement were reviewed in detail with the patient. Please refer to After Visit Summary for other counseling recommendations.   Return in about 3 weeks (around 01/13/2021), or LROB with KK.  Future Appointments  Date Time Provider Department Center  01/22/2021  3:35 PM Madlyn Frankel [facility]  02/06/2021  2:40 PM [name], NP [facility] None  02/18/2021  3:15 PM McKenzie, Mardene Celeste, MD [facility] None    Marylene Land, CNM

## 2020-12-23 NOTE — Patient Instructions (Signed)
Braxton Hicks Contractions Contractions of the uterus can occur throughout pregnancy, but they are not always a sign that you are in labor. You may have practice contractions called Braxton Hicks contractions. These false labor contractions are sometimes confused with true labor. What are Braxton Hicks contractions? Braxton Hicks contractions are tightening movements that occur in the muscles of the uterus before labor. Unlike true labor contractions, these contractions do not result in opening (dilation) and thinning of the lowest part of the uterus (cervix). Toward the end of pregnancy (32-34 weeks), Braxton Hicks contractions can happen more often and may become stronger. These contractions are sometimes difficult to tell apart from true labor because they can be very uncomfortable. How to tell the difference between true labor and false labor True labor Contractions last 30-70 seconds. Contractions become very regular. Discomfort is usually felt in the top of the uterus, and it spreads to the lower abdomen and low back. Contractions do not go away with walking. Contractions usually become stronger and more frequent. The cervix dilates and gets thinner. False labor Contractions are usually shorter, weaker, and farther apart than true labor contractions. Contractions are usually irregular. Contractions are often felt in the front of the lower abdomen and in the groin. Contractions may go away when you walk around or change positions while lying down. The cervix usually does not dilate or become thin. Sometimes, the only way to tell if you are in true labor is for your health care provider to look for changes in your cervix. Your health care provider will do a physical exam and may monitor your contractions. If you are in true labor, your health care provider will send you home with instructions about when to return to the hospital. You may continue to have Braxton Hicks contractions until you  go into true labor. Follow these instructions at home:  Take over-the-counter and prescription medicines only as told by your health care provider. If Braxton Hicks contractions are making you uncomfortable: Change your position from lying down or resting to walking, or change from walking to resting. Sit and rest in a tub of warm water. Drink enough fluid to keep your urine pale yellow. Dehydration may cause these contractions. Do slow and deep breathing several times an hour. Keep all follow-up visits. This is important. Contact a health care provider if: You have a fever. You have continuous pain in your abdomen. Your contractions become stronger, more regular, and closer together. You pass blood-tinged mucus. Get help right away if: You have fluid leaking or gushing from your vagina. You have bright red blood coming from your vagina. Your baby is not moving inside you as much as it used to. Summary You may have practice contractions called Braxton Hicks contractions. These false labor contractions are sometimes confused with true labor. Braxton Hicks contractions are usually shorter, weaker, farther apart, and less regular than true labor contractions. True labor contractions usually become stronger, more regular, and more frequent. Manage discomfort from Braxton Hicks contractions by changing position, resting in a warm bath, practicing deep breathing, and drinking plenty of water. Keep all follow-up visits. Contact your health care provider if your contractions become stronger, more regular, and closer together. This information is not intended to replace advice given to you by your health care provider. Make sure you discuss any questions you have with your health care provider. Document Revised: 04/14/2020 Document Reviewed: 04/14/2020 Elsevier Patient Education  2022 [employer]  

## 2020-12-24 ENCOUNTER — Other Ambulatory Visit: Payer: Self-pay

## 2020-12-24 ENCOUNTER — Inpatient Hospital Stay (HOSPITAL_COMMUNITY)
Admission: AD | Admit: 2020-12-24 | Discharge: 2020-12-24 | Disposition: A | Discharge status: Home / Self Care | Payer: Medicaid Other | Attending: Obstetrics and Gynecology | Admitting: Obstetrics and Gynecology

## 2020-12-24 DIAGNOSIS — O0993 Supervision of high risk pregnancy, unspecified, third trimester: Secondary | ICD-10-CM

## 2020-12-24 DIAGNOSIS — R0989 Other specified symptoms and signs involving the circulatory and respiratory systems: Secondary | ICD-10-CM

## 2020-12-24 DIAGNOSIS — O98513 Other viral diseases complicating pregnancy, third trimester: Secondary | ICD-10-CM | POA: Insufficient documentation

## 2020-12-24 DIAGNOSIS — O99513 Diseases of the respiratory system complicating pregnancy, third trimester: Secondary | ICD-10-CM | POA: Insufficient documentation

## 2020-12-24 DIAGNOSIS — Z3A33 33 weeks gestation of pregnancy: Secondary | ICD-10-CM

## 2020-12-24 DIAGNOSIS — J399 Disease of upper respiratory tract, unspecified: Secondary | ICD-10-CM

## 2020-12-24 DIAGNOSIS — U071 COVID-19: Secondary | ICD-10-CM | POA: Insufficient documentation

## 2020-12-24 DIAGNOSIS — O0990 Supervision of high risk pregnancy, unspecified, unspecified trimester: Secondary | ICD-10-CM

## 2020-12-24 LAB — SARS CORONAVIRUS 2 (TAT 6-24 HRS): SARS Coronavirus 2: POSITIVE — AB

## 2020-12-24 NOTE — MAU Note (Signed)
Sore throat, burning, chest pain/heartburn.  Dry cough- only sometimes- when very dry.  Hurts her ears when she swallows.  Burning and pressure in her nose, no runny nose.  Denies fever, denies loss of taste or sense of smell. +FM denies bleeding or leaking.  Had rtn check up yesterday, but none of these complaints at that time. Did not call office, they aren't open yet.
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
## 2021-01-22 ENCOUNTER — Encounter: Payer: Medicaid Other | Admitting: Student

## 2021-01-22 ENCOUNTER — Other Ambulatory Visit (HOSPITAL_COMMUNITY)
Admission: RE | Admit: 2021-01-22 | Discharge: 2021-01-22 | Disposition: A | Discharge status: Home / Self Care | Payer: Medicaid Other | Source: Ambulatory Visit | Attending: Student | Admitting: Student

## 2021-01-22 ENCOUNTER — Other Ambulatory Visit: Payer: Self-pay

## 2021-01-22 ENCOUNTER — Ambulatory Visit (INDEPENDENT_AMBULATORY_CARE_PROVIDER_SITE_OTHER): Payer: Medicaid Other | Admitting: Student

## 2021-01-22 VITALS — BP 111/77 | HR 93 | Wt 130.6 lb

## 2021-01-22 DIAGNOSIS — O0990 Supervision of high risk pregnancy, unspecified, unspecified trimester: Secondary | ICD-10-CM | POA: Diagnosis not present

## 2021-01-22 DIAGNOSIS — Z3A37 37 weeks gestation of pregnancy: Secondary | ICD-10-CM

## 2021-01-22 NOTE — Progress Notes (Signed)
   PRENATAL VISIT NOTE  Subjective:  Barbara Mckinney is a 25 y.o. G2P0101 at [redacted]w[redacted]d being seen today for ongoing prenatal care.  She is currently monitored for the following issues for this low-risk pregnancy and has S/P exploratory laparotomy; Generalized anxiety disorder; History of UTI; Difficulty urinating; Supervision of high risk pregnancy, antepartum; History of preterm delivery; and Fundal height low for dates in second trimester on their problem list.  Patient reports no complaints.  Contractions: Irritability. Vag. Bleeding: None.  Movement: Present. Denies leaking of fluid.   The following portions of the patient's history were reviewed and updated as appropriate: allergies, current medications, past family history, past medical history, past social history, past surgical history and problem list.   Objective:   Vitals:   01/22/21 1607  BP: 111/77  Pulse: 93  Weight: 130 lb 9.6 oz (59.2 kg)    Fetal Status: Fetal Heart Rate (bpm): 145 Fundal Height: 37 cm Movement: Present  Presentation: Vertex  General:  Alert, oriented and cooperative. Patient is in no acute distress.  Skin: Skin is warm and dry. No rash noted.   Cardiovascular: Normal heart rate noted  Respiratory: Normal respiratory effort, no problems with respiration noted  Abdomen: Soft, gravid, appropriate for gestational age.  Pain/Pressure: Present     Pelvic: Cervical exam performed in the presence of a chaperone Dilation: 1      Extremities: Normal range of motion.  Edema: None  Mental Status: Normal mood and affect. Normal behavior. Normal judgment and thought content.   Assessment and Plan:  Pregnancy: G2P0101 at [redacted]w[redacted]d 1. Supervision of high risk pregnancy, antepartum -reviewed warning signs of labor; vertex by exam today - GC/Chlamydia probe amp ()not at [facility] - Culture, beta strep (group b only)  Preterm labor symptoms and general obstetric precautions including but not limited to vaginal  bleeding, contractions, leaking of fluid and fetal movement were reviewed in detail with the patient. Please refer to After Visit Summary for other counseling recommendations.   Return in about 10 days (around 02/01/2021), or [facility].  Future Appointments  Date Time Provider Department Center  02/06/2021  2:40 PM [name], NP [facility] None  02/18/2021  3:15 PM McKenzie, Mardene Celeste, MD [facility] None  03/16/2021  3:15 PM Hermina Staggers, MD [facility]    Marylene Land, CNM

## 2021-01-23 LAB — GC/CHLAMYDIA PROBE AMP (~~LOC~~) NOT AT ARMC
Chlamydia: NEGATIVE
Comment: NEGATIVE
Comment: NORMAL
Neisseria Gonorrhea: NEGATIVE

## 2021-01-25 LAB — CULTURE, BETA STREP (GROUP B ONLY): Strep Gp B Culture: NEGATIVE

## 2021-02-04 ENCOUNTER — Other Ambulatory Visit: Payer: Self-pay

## 2021-02-04 ENCOUNTER — Ambulatory Visit (INDEPENDENT_AMBULATORY_CARE_PROVIDER_SITE_OTHER): Payer: Medicaid Other | Admitting: Family Medicine

## 2021-02-04 VITALS — BP 118/88 | HR 102 | Wt 128.9 lb

## 2021-02-04 DIAGNOSIS — Z3A39 39 weeks gestation of pregnancy: Secondary | ICD-10-CM

## 2021-02-04 DIAGNOSIS — O0990 Supervision of high risk pregnancy, unspecified, unspecified trimester: Secondary | ICD-10-CM

## 2021-02-04 DIAGNOSIS — Z8751 Personal history of pre-term labor: Secondary | ICD-10-CM

## 2021-02-04 NOTE — Progress Notes (Signed)
Pt states diclegis has stopped working,feels that she has lost weight due to not eating nor drinking anything.Also having problems with sleeping.

## 2021-02-04 NOTE — Progress Notes (Signed)
   Subjective:  Barbara Mckinney is a 25 y.o. G2P0101 at [redacted]w[redacted]d being seen today for ongoing prenatal care.  She is currently monitored for the following issues for this low-risk pregnancy and has S/P exploratory laparotomy; Generalized anxiety disorder; History of UTI; Difficulty urinating; Supervision of high risk pregnancy, antepartum; History of preterm delivery; and Fundal height low for dates in second trimester on their problem list.  Patient reports no complaints.  Contractions: Irritability. Vag. Bleeding: None.  Movement: Present. Denies leaking of fluid.   The following portions of the patient's history were reviewed and updated as appropriate: allergies, current medications, past family history, past medical history, past social history, past surgical history and problem list. Problem list updated.  Objective:   Vitals:   02/04/21 1608  BP: 118/88  Pulse: (!) 102  Weight: 128 lb 14.4 oz (58.5 kg)    Fetal Status: Fetal Heart Rate (bpm): 150   Movement: Present     General:  Alert, oriented and cooperative. Patient is in no acute distress.  Skin: Skin is warm and dry. No rash noted.   Cardiovascular: Normal heart rate noted  Respiratory: Normal respiratory effort, no problems with respiration noted  Abdomen: Soft, gravid, appropriate for gestational age. Pain/Pressure: Present     Pelvic: Vag. Bleeding: None Vag D/C Character: Mucous   Cervical exam performed Dilation: 2.5 Effacement (%): 80 Station: -2  Extremities: Normal range of motion.  Edema: None  Mental Status: Normal mood and affect. Normal behavior. Normal judgment and thought content.   Urinalysis:      Assessment and Plan:  Pregnancy: G2P0101 at [redacted]w[redacted]d  1. Supervision of high risk pregnancy, antepartum BP and FHR normal Cervix checked and already 2.5/80/-2, stripped membranes per request, labor soon is likely Desires 41 wk IOL Orders placed and form faxed today  2. History of preterm delivery At 36  wks  Term labor symptoms and general obstetric precautions including but not limited to vaginal bleeding, contractions, leaking of fluid and fetal movement were reviewed in detail with the patient. Please refer to After Visit Summary for other counseling recommendations.  Return in 1 week (on 02/11/2021) for [facility], ob visit.   Venora Maples, MD

## 2021-02-04 NOTE — Patient Instructions (Signed)

## 2021-02-05 ENCOUNTER — Encounter (HOSPITAL_COMMUNITY): Payer: Self-pay | Admitting: Obstetrics and Gynecology

## 2021-02-05 ENCOUNTER — Inpatient Hospital Stay (HOSPITAL_COMMUNITY)
Admission: AD | Admit: 2021-02-05 | Discharge: 2021-02-06 | DRG: 807 | Disposition: A | Discharge status: Home / Self Care | Payer: Medicaid Other | Attending: Family Medicine | Admitting: Family Medicine

## 2021-02-05 ENCOUNTER — Inpatient Hospital Stay (HOSPITAL_COMMUNITY): Payer: Medicaid Other | Admitting: Anesthesiology

## 2021-02-05 DIAGNOSIS — O480 Post-term pregnancy: Principal | ICD-10-CM | POA: Diagnosis present

## 2021-02-05 DIAGNOSIS — Z3A39 39 weeks gestation of pregnancy: Secondary | ICD-10-CM

## 2021-02-05 DIAGNOSIS — O0990 Supervision of high risk pregnancy, unspecified, unspecified trimester: Secondary | ICD-10-CM

## 2021-02-05 LAB — CBC
HCT: 39.7 % (ref 36.0–46.0)
Hemoglobin: 13.7 g/dL (ref 12.0–15.0)
MCH: 31.3 pg (ref 26.0–34.0)
MCHC: 34.5 g/dL (ref 30.0–36.0)
MCV: 90.6 fL (ref 80.0–100.0)
Platelets: 164 10*3/uL (ref 150–400)
RBC: 4.38 MIL/uL (ref 3.87–5.11)
RDW: 12.9 % (ref 11.5–15.5)
WBC: 11.8 10*3/uL — ABNORMAL HIGH (ref 4.0–10.5)
nRBC: 0 % (ref 0.0–0.2)

## 2021-02-05 LAB — TYPE AND SCREEN
ABO/RH(D): A POS
Antibody Screen: NEGATIVE

## 2021-02-05 LAB — RPR: RPR Ser Ql: NONREACTIVE

## 2021-02-05 MED ORDER — ONDANSETRON HCL 4 MG PO TABS: 4.0000 mg | ORAL_TABLET | ORAL | Status: DC | PRN

## 2021-02-05 MED ORDER — BENZOCAINE-MENTHOL 20-0.5 % EX AERO
1.0000 "application " | INHALATION_SPRAY | CUTANEOUS | Status: DC | PRN
  Administered 2021-02-06: 1 via TOPICAL
  Filled 2021-02-05: qty 56

## 2021-02-05 MED ORDER — FENTANYL CITRATE (PF) 100 MCG/2ML IJ SOLN
50.0000 ug | INTRAMUSCULAR | Status: DC | PRN
  Administered 2021-02-05: 100 ug via INTRAVENOUS
  Filled 2021-02-05: qty 2

## 2021-02-05 MED ORDER — SIMETHICONE 80 MG PO CHEW: 80.0000 mg | CHEWABLE_TABLET | ORAL | Status: DC | PRN

## 2021-02-05 MED ORDER — PRENATAL MULTIVITAMIN CH
1.0000 | ORAL_TABLET | Freq: Every day | ORAL | Status: DC
  Administered 2021-02-05 – 2021-02-06 (×2): 1 via ORAL
  Filled 2021-02-05 (×2): qty 1

## 2021-02-05 MED ORDER — FENTANYL-BUPIVACAINE-NACL 0.5-0.125-0.9 MG/250ML-% EP SOLN
12.0000 mL/h | EPIDURAL | Status: DC | PRN
  Filled 2021-02-05: qty 250

## 2021-02-05 MED ORDER — SOD CITRATE-CITRIC ACID 500-334 MG/5ML PO SOLN: 30.0000 mL | ORAL | Status: DC | PRN

## 2021-02-05 MED ORDER — PHENYLEPHRINE 40 MCG/ML (10ML) SYRINGE FOR IV PUSH (FOR BLOOD PRESSURE SUPPORT)
80.0000 ug | PREFILLED_SYRINGE | INTRAVENOUS | Status: DC | PRN
  Filled 2021-02-05: qty 10

## 2021-02-05 MED ORDER — MEASLES, MUMPS & RUBELLA VAC IJ SOLR: 0.5000 mL | Freq: Once | INTRAMUSCULAR | Status: DC

## 2021-02-05 MED ORDER — DIPHENHYDRAMINE HCL 50 MG/ML IJ SOLN
12.5000 mg | INTRAMUSCULAR | Status: DC | PRN
Start: 2021-02-05 — End: 2021-02-05

## 2021-02-05 MED ORDER — DIPHENHYDRAMINE HCL 25 MG PO CAPS: 25.0000 mg | ORAL_CAPSULE | Freq: Four times a day (QID) | ORAL | Status: DC | PRN

## 2021-02-05 MED ORDER — ACETAMINOPHEN 325 MG PO TABS
650.0000 mg | ORAL_TABLET | ORAL | Status: DC | PRN
  Administered 2021-02-05: 650 mg via ORAL
  Filled 2021-02-05: qty 2

## 2021-02-05 MED ORDER — FENTANYL-BUPIVACAINE-NACL 0.5-0.125-0.9 MG/250ML-% EP SOLN
EPIDURAL | Status: DC | PRN
  Administered 2021-02-05: 12 mL/h via EPIDURAL

## 2021-02-05 MED ORDER — SENNOSIDES-DOCUSATE SODIUM 8.6-50 MG PO TABS
2.0000 | ORAL_TABLET | Freq: Every day | ORAL | Status: DC
  Administered 2021-02-06: 2 via ORAL
  Filled 2021-02-05: qty 2

## 2021-02-05 MED ORDER — DIBUCAINE (PERIANAL) 1 % EX OINT: 1.0000 "application " | TOPICAL_OINTMENT | CUTANEOUS | Status: DC | PRN

## 2021-02-05 MED ORDER — LACTATED RINGERS IV SOLN
500.0000 mL | INTRAVENOUS | Status: DC | PRN
  Administered 2021-02-05: 1000 mL via INTRAVENOUS

## 2021-02-05 MED ORDER — OXYTOCIN-SODIUM CHLORIDE 30-0.9 UT/500ML-% IV SOLN
2.5000 [IU]/h | INTRAVENOUS | Status: DC
  Filled 2021-02-05: qty 500

## 2021-02-05 MED ORDER — ONDANSETRON HCL 4 MG/2ML IJ SOLN: 4.0000 mg | Freq: Four times a day (QID) | INTRAMUSCULAR | Status: DC | PRN

## 2021-02-05 MED ORDER — ACETAMINOPHEN 325 MG PO TABS: 650.0000 mg | ORAL_TABLET | ORAL | Status: DC | PRN

## 2021-02-05 MED ORDER — LIDOCAINE HCL (PF) 1 % IJ SOLN: 30.0000 mL | INTRAMUSCULAR | Status: DC | PRN

## 2021-02-05 MED ORDER — WITCH HAZEL-GLYCERIN EX PADS: 1.0000 "application " | MEDICATED_PAD | CUTANEOUS | Status: DC | PRN

## 2021-02-05 MED ORDER — MEDROXYPROGESTERONE ACETATE 150 MG/ML IM SUSP: 150.0000 mg | INTRAMUSCULAR | Status: DC | PRN

## 2021-02-05 MED ORDER — TETANUS-DIPHTH-ACELL PERTUSSIS 5-2.5-18.5 LF-MCG/0.5 IM SUSY: 0.5000 mL | PREFILLED_SYRINGE | Freq: Once | INTRAMUSCULAR | Status: DC

## 2021-02-05 MED ORDER — LACTATED RINGERS IV SOLN: INTRAVENOUS | Status: DC

## 2021-02-05 MED ORDER — COCONUT OIL OIL: 1.0000 "application " | TOPICAL_OIL | Status: DC | PRN

## 2021-02-05 MED ORDER — ACETAMINOPHEN 500 MG PO TABS
1000.0000 mg | ORAL_TABLET | Freq: Once | ORAL | Status: AC
  Administered 2021-02-05: 1000 mg via ORAL
  Filled 2021-02-05: qty 2

## 2021-02-05 MED ORDER — LIDOCAINE HCL (PF) 1 % IJ SOLN
INTRAMUSCULAR | Status: DC | PRN
  Administered 2021-02-05: 10 mL via EPIDURAL
  Administered 2021-02-05: 2 mL via EPIDURAL

## 2021-02-05 MED ORDER — LACTATED RINGERS IV SOLN: 500.0000 mL | Freq: Once | INTRAVENOUS | Status: DC

## 2021-02-05 MED ORDER — EPHEDRINE 5 MG/ML INJ: 10.0000 mg | INTRAVENOUS | Status: DC | PRN

## 2021-02-05 MED ORDER — ONDANSETRON HCL 4 MG/2ML IJ SOLN: 4.0000 mg | INTRAMUSCULAR | Status: DC | PRN

## 2021-02-05 MED ORDER — IBUPROFEN 600 MG PO TABS
600.0000 mg | ORAL_TABLET | Freq: Four times a day (QID) | ORAL | Status: DC
  Administered 2021-02-05 – 2021-02-06 (×5): 600 mg via ORAL
  Filled 2021-02-05 (×5): qty 1

## 2021-02-05 MED ORDER — OXYTOCIN BOLUS FROM INFUSION: 333.0000 mL | Freq: Once | INTRAVENOUS | Status: DC

## 2021-02-05 MED ORDER — PHENYLEPHRINE 40 MCG/ML (10ML) SYRINGE FOR IV PUSH (FOR BLOOD PRESSURE SUPPORT): 80.0000 ug | PREFILLED_SYRINGE | INTRAVENOUS | Status: DC | PRN

## 2021-02-05 NOTE — Anesthesia Procedure Notes (Signed)
Epidural Patient location during procedure: OB Start time: 02/05/2021 6:50 AM End time: 02/05/2021 6:58 AM  Staffing Anesthesiologist: Lannie Fields, DO Performed: anesthesiologist   Preanesthetic Checklist Completed: patient identified, IV checked, risks and benefits discussed, monitors and equipment checked, pre-op evaluation and timeout performed  Epidural Patient position: sitting Prep: DuraPrep and site prepped and draped Patient monitoring: continuous pulse ox, blood pressure, heart rate and cardiac monitor Approach: midline Location: L3-L4 Injection technique: LOR air  Needle:  Needle type: Tuohy  Needle gauge: 17 G Needle length: 9 cm Needle insertion depth: 5 cm Catheter type: closed end flexible Catheter size: 19 Gauge Catheter at skin depth: 10 cm Test dose: negative  Assessment Sensory level: T8 Events: blood not aspirated, injection not painful, no injection resistance, no paresthesia and negative IV test  Additional Notes Patient identified. Risks/Benefits/Options discussed with patient including but not limited to bleeding, infection, nerve damage, paralysis, failed block, incomplete pain control, headache, blood pressure changes, nausea, vomiting, reactions to medication both or allergic, itching and postpartum back pain. Confirmed with bedside nurse the patient's most recent platelet count. Confirmed with patient that they are not currently taking any anticoagulation, have any bleeding history or any family history of bleeding disorders. Patient expressed understanding and wished to proceed. All questions were answered. Sterile technique was used throughout the entire procedure. Please see nursing notes for vital signs. Test dose was given through epidural catheter and negative prior to continuing to dose epidural or start infusion. Warning signs of high block given to the patient including shortness of breath, tingling/numbness in hands, complete motor  block, or any concerning symptoms with instructions to call for help. Patient was given instructions on fall risk and not to get out of bed. All questions and concerns addressed with instructions to call with any issues or inadequate analgesia.  Reason for block:procedure for pain

## 2021-02-05 NOTE — Discharge Summary (Signed)
Postpartum Discharge Summary    Patient Name: Barbara Mckinney DOB: [birth year] MRN: [id number]  Date of admission: 02/05/2021 Delivery date:02/05/2021  Delivering provider: Angus Seller  Date of discharge: 02/06/2021  Admitting diagnosis: Post-dates pregnancy [O48.0] Intrauterine pregnancy: [redacted]w[redacted]d    Secondary diagnosis:  Active Problems:   Post-dates pregnancy   Vaginal delivery  Additional problems: None    Discharge diagnosis: Term Pregnancy Delivered                                              Post partum procedures: None Augmentation: AROM Complications: None  Hospital course: Onset of Labor With Vaginal Delivery      25y.o. yo [id number] admitted in Active Labor on 02/05/2021. Patient had an uncomplicated labor course as follows:  Membrane Rupture Time/Date: 7:53 AM ,02/05/2021   Delivery Method:Vaginal, Spontaneous  Episiotomy: None  Lacerations:    Patient had an uncomplicated postpartum course.  She is ambulating, tolerating a regular diet, passing flatus, and urinating well. Patient is discharged home in stable condition on 02/06/21.  Newborn Data: Birth date:02/05/2021  Birth time:8:13 AM  Gender:Female  Living status:Living  Apgars:9 ,9  Weight:2940 g   Magnesium Sulfate received: No BMZ received: No Rhophylac:No MMR:N/A T-DaP:Given prenatally Flu: N/A Transfusion:No  Physical exam  Vitals:   02/05/21 1735 02/05/21 2029 02/05/21 2330 02/06/21 0520  BP: 107/68 107/76 109/79 111/64  Pulse: 80 72 78 81  Resp: 15 16 16 18   Temp: 97.8 F (36.6 C) 97.9 F (36.6 C) 98.1 F (36.7 C) 98 F (36.7 C)  TempSrc: Oral Oral Oral Oral  SpO2: 97% 100% 100% 100%   General: alert, cooperative, and no distress Lochia: appropriate Uterine Fundus: firm DVT Evaluation: no LE edema, no calf tenderness to palpation  Labs: Lab Results  Component Value Date   WBC 11.8 (H) 02/05/2021   HGB 13.7 02/05/2021   HCT 39.7 02/05/2021   MCV 90.6 02/05/2021    PLT 164 02/05/2021   CMP Latest Ref Rng & Units 12/11/2020  Glucose 70 - 99 mg/dL 90  BUN 6 - 20 mg/dL 7  Creatinine 0.44 - 1.00 mg/dL 0.55  Sodium 135 - 145 mmol/L 136  Potassium 3.5 - 5.1 mmol/L 4.0  Chloride 98 - 111 mmol/L 105  CO2 22 - 32 mmol/L 24  Calcium 8.9 - 10.3 mg/dL 8.6(L)  Total Protein 6.0 - 8.5 g/dL -  Total Bilirubin 0.0 - 1.2 mg/dL -  Alkaline Phos 44 - 121 IU/L -  AST 0 - 40 IU/L -  ALT 0 - 44 U/L -   Edinburgh Score: Edinburgh Postnatal Depression Scale Screening Tool 02/06/2021  I have been able to laugh and see the funny side of things. 0  I have looked forward with enjoyment to things. 0  I have blamed myself unnecessarily when things went wrong. 0  I have been anxious or worried for no good reason. 0  I have felt scared or panicky for no good reason. 0  Things have been getting on top of me. 0  I have been so unhappy that I have had difficulty sleeping. 0  I have felt sad or miserable. 0  I have been so unhappy that I have been crying. 0  The thought of harming myself has occurred to me. 0  Edinburgh Postnatal Depression Scale Total  0     After visit meds:  Allergies as of 02/06/2021       Reactions   Bacitracin Rash        Medication List     STOP taking these medications    Blood Pressure Monitoring Devi   Doxylamine-Pyridoxine 10-10 MG Tbec Commonly known as: Diclegis   terconazole 0.4 % vaginal cream Commonly known as: Terazol 7       TAKE these medications    acetaminophen 500 MG tablet Commonly known as: TYLENOL Take 2 tablets (1,000 mg total) by mouth every 8 (eight) hours as needed (pain).   ibuprofen 200 MG tablet Commonly known as: ADVIL Take 2 tablets (400 mg total) by mouth every 6 (six) hours as needed (pain).   prenatal vitamin w/FE, FA 27-1 MG Tabs tablet Take 1 tablet by mouth daily at 12 noon.         Discharge home in stable condition Infant Feeding: Bottle and Breast Infant Disposition: home with  mother Discharge instruction: per After Visit Summary and Postpartum booklet. Activity: Advance as tolerated. Pelvic rest for 6 weeks.  Diet: routine diet Future Appointments: Future Appointments  Date Time Provider [place]  02/18/2021  3:15 PM McKenzie, Candee Furbish, MD [facility] None  03/16/2021  8:55 AM Aletha Halim, MD Operating Room [facility]  03/27/2021 11:00 AM Vevelyn Francois, NP [facility] None   Follow up Visit: Message sent to [facility] by Dr. Cy Blamer on 02/05/2021  Please schedule this patient for a In person postpartum visit in 4 weeks with the following provider: Any provider. Additional Postpartum F/U: None   Low risk pregnancy complicated by:  None Delivery mode:  Vaginal, Spontaneous  Anticipated Birth Control:  Condoms   02/06/2021 Genia Del, MD OB Fellow  Faculty Practice

## 2021-02-05 NOTE — Anesthesia Preprocedure Evaluation (Signed)
Anesthesia Evaluation  Patient identified by MRN, date of birth, ID band Patient awake    Reviewed: Allergy & Precautions, NPO status , Patient's Chart, lab work & pertinent test results  Airway Mallampati: II  TM Distance: >3 FB Neck ROM: Full    Dental no notable dental hx. (+) Teeth Intact, Dental Advisory Given   Pulmonary neg pulmonary ROS,    Pulmonary exam normal breath sounds clear to auscultation       Cardiovascular negative cardio ROS Normal cardiovascular exam Rhythm:Regular Rate:Normal     Neuro/Psych PSYCHIATRIC DISORDERS Anxiety negative neurological ROS     GI/Hepatic negative GI ROS, Neg liver ROS,   Endo/Other  negative endocrine ROS  Renal/GU negative Renal ROS  negative genitourinary   Musculoskeletal negative musculoskeletal ROS (+)   Abdominal   Peds  Hematology negative hematology ROS (+)   Anesthesia Other Findings   Reproductive/Obstetrics (+) Pregnancy                             Anesthesia Physical Anesthesia Plan  ASA: 2  Anesthesia Plan: Epidural   Post-op Pain Management:    Induction:   PONV Risk Score and Plan: Treatment may vary due to age or medical condition  Airway Management Planned: Natural Airway  Additional Equipment:   Intra-op Plan:   Post-operative Plan:   Informed Consent: I have reviewed the patients History and Physical, chart, labs and discussed the procedure including the risks, benefits and alternatives for the proposed anesthesia with the patient or authorized representative who has indicated his/her understanding and acceptance.       Plan Discussed with: Anesthesiologist  Anesthesia Plan Comments: (Patient identified. Risks, benefits, options discussed with patient including but not limited to bleeding, infection, nerve damage, paralysis, failed block, incomplete pain control, headache, blood pressure changes, nausea,  vomiting, reactions to medication, itching, and post partum back pain. Confirmed with bedside nurse the patient's most recent platelet count. Confirmed with the patient that they are not taking any anticoagulation, have any bleeding history or any family history of bleeding disorders. Patient expressed understanding and wishes to proceed. All questions were answered. )        Anesthesia Quick Evaluation

## 2021-02-05 NOTE — Lactation Note (Signed)
This note was copied from a baby's chart. Lactation Consultation Note  Patient Name: Barbara Mckinney [id number] Date: 02/05/2021 Reason for consult: Follow-up assessment;Term Age:25 hours Per mom, her feeding choice is breast and formula feeding.  LC did not observe latch, infant asleep in basinet and was recently formula feed 8 mls of formula.  LC discussed input and output with parents. Mom made aware of O/P services, breastfeeding support groups, community resources, and our phone # for post-discharge questions.   LC gave mom hand pump and explained how to use, mom had pumped 8 mls of colostrum and was still pumping when LC left the room.  Mom's plan: 1- Mom plans to latch infant at the  breast every other feeding. 2- Mom knows to feed infant according to feeding cues, 8 to 12+ times within 24 hours, when latching infant at the breast,  she will breastfeed infant skin to skin.  3- At the next feeding mom plans to latch infant  at the breast and  afterwards offer  infant the 8+ mls of EBM that she pumped with her hand pump. 4- Mom knows to call RN or LC if she has any questions, concerns or needs assistance with latching infant at the breast. 5- Mom knows on Day 1 if she supplement after breastfeeding him base on infant's age and hours of life, mom will offer (5-7 mls ) EBM/Formula per feeding and if infant is given bottle only and do not latch at the breast , infant's intake is ( 5 -15 mls) EBM/Formula  per feeding.  Maternal Data Has patient been taught Hand Expression?: Yes Does the patient have breastfeeding experience prior to this delivery?: Yes How long did the patient breastfeed?: She pumped only with first child briefly infant was NICU  Feeding Mother's Current Feeding Choice: Breast Milk and Formula Nipple Type: Slow - flow  LATCH Score                    Lactation Tools Discussed/Used Tools: Pump Breast pump type: Manual Pump Education: Setup, frequency, and  cleaning;Milk Storage Reason for Pumping: Mom choses to use hand pump when offering infant formula. Her plan is to BF infant every other feeding. Pumping frequency: Mom will pump when not latching infant at the breast. Pumped volume: 8 mL  Interventions Interventions: Breast feeding basics reviewed;Skin to skin;Hand pump  Discharge Pump: Manual WIC Program: Yes  Consult Status Consult Status: Follow-up Date: 02/06/21 Follow-up type: In-patient    Danelle Earthly 02/05/2021, 3:07 PM

## 2021-02-05 NOTE — Lactation Note (Signed)
This note was copied from a baby's chart. Lactation Consultation Note  Patient Name: Barbara Mckinney [id number] Date: 02/05/2021 Reason for consult: L&D Initial assessment;Term;Other (Comment) (LC visit at 32 mins PP, P2 , baby STS on mom's chest rooting / LC offered to assist/ mom receptive/ hand expressed several drops / baby latched easily with depth fed for with swallows/ released few seconds and relatched for total of 12 mins.) Age: 25 mins PP/ Latch of 9       Maternal Data Has patient been taught Hand Expression?: Yes Does the patient have breastfeeding experience prior to this delivery?: Yes How long did the patient breastfeed?: per mom 1st baby was in NICU / pumped and then baby would never latch well. per mom baby seemed not to like my milk  Feeding Mother's Current Feeding Choice: Breast Milk  LATCH Score Latch: Grasps breast easily, tongue down, lips flanged, rhythmical sucking.  Audible Swallowing: Spontaneous and intermittent  Type of Nipple: Everted at rest and after stimulation  Comfort (Breast/Nipple): Soft / non-tender  Hold (Positioning): Assistance needed to correctly position infant at breast and maintain latch.  LATCH Score: 9   Lactation Tools Discussed/Used    Interventions Interventions: Breast feeding basics reviewed;Assisted with latch;Skin to skin;Breast massage;Hand express;Breast compression;Adjust position;Support pillows;Education  Discharge    Consult Status Consult Status: Follow-up from L&D Date: 02/05/21 Follow-up type: In-patient    Matilde Sprang Deundre Thong 02/05/2021, 9:07 AM
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
## 2021-02-05 NOTE — MAU Note (Signed)
Pt presents to MAU c/o ctx increasing in intensity this morning and presence of bloody show.  Pt endorses +FM, denies LOF.

## 2021-02-05 NOTE — Anesthesia Postprocedure Evaluation (Signed)
Anesthesia Post Note  Patient: Barbara Mckinney  Procedure(s) Performed: AN AD HOC LABOR EPIDURAL     Patient location during evaluation: Mother Baby Anesthesia Type: Epidural Level of consciousness: awake and alert Pain management: pain level controlled Vital Signs Assessment: post-procedure vital signs reviewed and stable Respiratory status: spontaneous breathing, nonlabored ventilation and respiratory function stable Cardiovascular status: stable Postop Assessment: no headache, no backache and epidural receding Anesthetic complications: no   No notable events documented.  Last Vitals:  Vitals:   02/05/21 1100 02/05/21 1200  BP: 110/74 102/62  Pulse: 77 65  Resp: 18 16  Temp: 36.7 C 36.7 C  SpO2:      Last Pain:  Vitals:   02/05/21 1349  TempSrc:   PainSc: 0-No pain   Pain Goal: Patients Stated Pain Goal: 2 (02/05/21 1349)                 Azariah Bonura

## 2021-02-05 NOTE — Social Work (Signed)
MOB was referred for history of anxiety.   * Referral screened out by Clinical Social Worker because none of the following criteria appear to apply:  ~ History of anxiety/depression during this pregnancy, or of post-partum depression following prior delivery. No prenatal concerns noted.  ~ Diagnosis of anxiety and/or depression within last 3 years. CSW reviewed chart and notes MOB diagnosed in 2014.  OR * MOB's symptoms currently being treated with medication and/or therapy.  Please contact the Clinical Social Worker if needs arise, by [facility] request, or if MOB scores greater than 9/yes to question 10 on Edinburgh Postpartum Depression Screen.  Manfred Arch, LCSWA Clinical Social Work [employer] and [employer]  [phone]

## 2021-02-06 ENCOUNTER — Ambulatory Visit: Payer: Medicaid Other | Admitting: Nurse Practitioner

## 2021-02-06 LAB — BIRTH TISSUE RECOVERY COLLECTION (PLACENTA DONATION)

## 2021-02-06 MED ORDER — ACETAMINOPHEN 500 MG PO TABS: 1000.0000 mg | ORAL_TABLET | Freq: Three times a day (TID) | ORAL | Status: DC | PRN

## 2021-02-06 MED ORDER — IBUPROFEN 200 MG PO TABS: 400.0000 mg | ORAL_TABLET | Freq: Four times a day (QID) | ORAL | Status: DC | PRN

## 2021-02-06 NOTE — Lactation Note (Signed)
This note was copied from a baby's chart. Lactation Consultation Note  Patient Name: Barbara Mckinney Date: 02/06/2021 Reason for consult: Follow-up assessment;Term;Infant weight loss;Other (Comment) (3 % weight loss) Bili check 5.3 - 22 hours.  Age:25 hours  Mom desires early D/C .  Baby awake and rooting even though baby recently fed breast and formula per mom.  LC reviewed and updated the doc flow sheets per mom / WNL for D/C .  LC offered to assist to latch and mom receptive - see Latch score below.  Reviewed BF D/C teaching.  LC provided the [facility] brochure with resources and BFSG.   Maternal Data Has patient been taught Hand Expression?: Yes  Feeding Mother's Current Feeding Choice: Breast Milk and Formula  LATCH Score Latch: Grasps breast easily, tongue down, lips flanged, rhythmical sucking.  Audible Swallowing: Spontaneous and intermittent  Type of Nipple: Everted at rest and after stimulation  Comfort (Breast/Nipple): Soft / non-tender  Hold (Positioning): Assistance needed to correctly position infant at breast and maintain latch.  LATCH Score: 9   Lactation Tools Discussed/Used Tools: Pump;Flanges Flange Size: 24 Breast pump type: Manual Pump Education: Milk Storage  Interventions Interventions: Breast feeding basics reviewed;Assisted with latch;Skin to skin;Breast massage;Hand express;Breast compression;Adjust position;Support pillows;Position options;Hand pump;Education  Discharge Discharge Education: Engorgement and breast care;Warning signs for feeding baby Pump: Personal;Manual  Consult Status Consult Status: Complete Date: 02/06/21    Kathrin Greathouse 02/06/2021, 10:38 AM

## 2021-02-06 NOTE — Lactation Note (Signed)
This note was copied from a baby's chart. Lactation Consultation Note Mom had called out for assistance BF. LC was in a room w/pt. When LC came to see mom , baby was sleeping, mom in BR, FOB stated baby is good now sleeping we.. Asked to call for next feeding.  Patient Name: Boy Milanna Kozlov [id number] Date: 02/06/2021   Age:25 hours  Maternal Data    Feeding Nipple Type: Slow - flow  LATCH Score                    Lactation Tools Discussed/Used    Interventions    Discharge    Consult Status      Charyl Dancer 02/06/2021, 12:52 AM

## 2021-02-09 ENCOUNTER — Telehealth: Payer: Self-pay

## 2021-02-09 NOTE — Telephone Encounter (Signed)
Transition Care Management Follow-up Telephone Call Date of discharge and from where: 02/06/2021-[facility].  How have you been since you were released from the hospital? Patient stated she is feeling fine.  Any questions or concerns? No  Items Reviewed: Did the pt receive and understand the discharge instructions provided? Yes  Medications obtained and verified?  No medications given at discharge.  Other? No  Any new allergies since your discharge? No  Dietary orders reviewed? N/A Do you have support at home? Yes   Home Care and Equipment/Supplies: Were home health services ordered? not applicable If so, what is the name of the agency? N/A  Has the agency set up a time to come to the patient's home? not applicable Were any new equipment or medical supplies ordered?  No What is the name of the medical supply agency? N/A Were you able to get the supplies/equipment? not applicable Do you have any questions related to the use of the equipment or supplies? No  Functional Questionnaire: (I = Independent and D = Dependent) ADLs: I  Bathing/Dressing- I  Meal Prep- I  Eating- I  Maintaining continence- I  Transferring/Ambulation- I  Managing Meds- I  Follow up appointments reviewed:  PCP Hospital f/u appt confirmed? No   Specialist Hospital f/u appt confirmed? Yes  Scheduled to see Dr. Vergie Living on 03/16/2021 @ 8:55 am. Are transportation arrangements needed? No  If their condition worsens, is the pt aware to call PCP or go to the Emergency Dept.? Yes Was the patient provided with contact information for the PCP's office or ED? Yes Was to pt encouraged to call back with questions or concerns? Yes

## 2021-02-16 ENCOUNTER — Telehealth (HOSPITAL_COMMUNITY): Payer: Self-pay

## 2021-02-16 ENCOUNTER — Inpatient Hospital Stay (HOSPITAL_COMMUNITY): Payer: Medicaid Other

## 2021-02-16 ENCOUNTER — Inpatient Hospital Stay (HOSPITAL_COMMUNITY)
Admission: AD | Admit: 2021-02-16 | Payer: Medicaid Other | Source: Home / Self Care | Admitting: Obstetrics & Gynecology

## 2021-02-16 NOTE — Telephone Encounter (Signed)
"  I'm doing good." Patient has no questions or concerns about her healing.  "He's doing fine. Eating and gaining weight. He sleeps well in a bassinet." RN reviewed ABC's of safe sleep with patient. Patient declines any questions or concerns about baby.  EPDS score is 0.  Marcelino Duster [facility] 02/16/2021,1707

## 2021-02-18 ENCOUNTER — Ambulatory Visit: Payer: Medicaid Other | Admitting: Urology

## 2021-03-04 ENCOUNTER — Encounter: Payer: Self-pay | Admitting: Urology

## 2021-03-04 ENCOUNTER — Other Ambulatory Visit: Payer: Self-pay

## 2021-03-04 ENCOUNTER — Telehealth (INDEPENDENT_AMBULATORY_CARE_PROVIDER_SITE_OTHER): Payer: Medicaid Other | Admitting: Urology

## 2021-03-04 DIAGNOSIS — Z87440 Personal history of urinary (tract) infections: Secondary | ICD-10-CM

## 2021-03-04 DIAGNOSIS — R350 Frequency of micturition: Secondary | ICD-10-CM

## 2021-03-04 NOTE — Patient Instructions (Signed)
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
## 2021-03-12 ENCOUNTER — Encounter: Payer: Self-pay | Admitting: General Practice

## 2021-03-16 ENCOUNTER — Other Ambulatory Visit: Payer: Self-pay

## 2021-03-16 ENCOUNTER — Ambulatory Visit (INDEPENDENT_AMBULATORY_CARE_PROVIDER_SITE_OTHER): Payer: Medicaid Other | Admitting: Obstetrics and Gynecology

## 2021-03-16 ENCOUNTER — Encounter: Payer: Medicaid Other | Admitting: Obstetrics and Gynecology

## 2021-03-16 ENCOUNTER — Encounter: Payer: Self-pay | Admitting: Obstetrics and Gynecology

## 2021-03-16 VITALS — BP 120/85 | HR 89 | Ht 63.0 in | Wt 113.5 lb

## 2021-03-16 DIAGNOSIS — N393 Stress incontinence (female) (male): Secondary | ICD-10-CM | POA: Diagnosis not present

## 2021-03-16 NOTE — Progress Notes (Signed)
PT states that she is only going tom use condoms & she has incontinence when she coughs.

## 2021-03-16 NOTE — Patient Instructions (Signed)
Call us if your urine leakage isn't any better in 3-6 months

## 2021-03-16 NOTE — Progress Notes (Signed)
    Post Partum Visit Note  Barbara Mckinney is a 25 y.o. [id number] s/p 8/18 SVD/peri-uretheral laceration (not repaired) at 39wks after presenting in active labor. Pregnancy c/b h/o PTB.  Anesthesia: epidural. Postpartum course has been uncomplicated. Baby is doing well. Baby is feeding by bottle - [employer] Good Start. Bleeding no bleeding. Bowel function is normal. Bladder function is normal. Contraception method is abstinence. Postpartum depression screening: negative.  She has had a period for a week recently. Pap negative 2021  She notes SUI s/s; she denies any OAB and any LUTs s/s. Patient also thinks she has some tissue hanging out of the vagina.     Edinburgh Postnatal Depression Scale - 03/16/21 0907       Edinburgh Postnatal Depression Scale:  In the Past 7 Days   I have been able to laugh and see the funny side of things. 0    I have looked forward with enjoyment to things. 0    I have blamed myself unnecessarily when things went wrong. 0    I have been anxious or worried for no good reason. 0    I have felt scared or panicky for no good reason. 0    Things have been getting on top of me. 0    I have been so unhappy that I have had difficulty sleeping. 0    I have felt sad or miserable. 0    I have been so unhappy that I have been crying. 0    The thought of harming myself has occurred to me. 0    Edinburgh Postnatal Depression Scale Total 0             Review of Systems Pertinent items noted in HPI and remainder of comprehensive ROS otherwise negative.  Objective:  BP 120/85   Pulse 89   Ht 5\' 3"  (1.6 m)   Wt 113 lb 8 oz (51.5 kg)   LMP 04/18/2020   Breastfeeding No   BMI 20.11 kg/m    NAD Abdomen: soft, nttp, nd EGBUS: normal Vagina: at the introitus at 7 o'cloc, there is a 41mm piece of vaginal tissue that is at 0 to +0.5 at the entrance of the vagina, it is nttp and is normal appearing tissue Cervix: normal Uterus: normal  Assessment:   Normal PP  course Plan:   *PP: patient unsure about BC and to let 4m know if she's interested in anything. Pt denies any s/s from the vaginal tissue on exam and I told her if she ever has any s/s like pain, dyspareunia, bleeding, spotting, etc to let us know but I recommend just exp management *SUI: will send UCx. Pelvic floor PT offered to pt but she'd like to do at home exercise and regular voiding and see if it resolves over the next 3-1m  RTC: PRN  11m, MD Center for [facility], [facility]

## 2021-03-19 LAB — URINE CULTURE: Organism ID, Bacteria: NO GROWTH

## 2021-03-27 ENCOUNTER — Encounter: Payer: Self-pay | Admitting: Nurse Practitioner

## 2021-03-27 ENCOUNTER — Other Ambulatory Visit: Payer: Self-pay

## 2021-03-27 ENCOUNTER — Ambulatory Visit: Payer: Medicaid Other | Admitting: Nurse Practitioner

## 2021-03-27 VITALS — BP 115/78 | HR 90 | Temp 98.0°F | Ht 63.0 in | Wt 114.4 lb

## 2021-03-27 DIAGNOSIS — R350 Frequency of micturition: Secondary | ICD-10-CM | POA: Diagnosis not present

## 2021-03-27 LAB — POCT URINALYSIS DIPSTICK
Bilirubin, UA: NEGATIVE
Blood, UA: NEGATIVE
Glucose, UA: NEGATIVE
Ketones, UA: NEGATIVE
Nitrite, UA: NEGATIVE
Protein, UA: NEGATIVE
Spec Grav, UA: 1.01 (ref 1.010–1.025)
Urobilinogen, UA: 0.2 E.U./dL
pH, UA: 6 (ref 5.0–8.0)
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
## 2021-03-31 LAB — URINE CULTURE

## 2021-04-03 ENCOUNTER — Ambulatory Visit: Payer: Medicaid Other | Admitting: Urology

## 2021-04-03 DIAGNOSIS — N393 Stress incontinence (female) (male): Secondary | ICD-10-CM

## 2021-04-09 ENCOUNTER — Other Ambulatory Visit: Payer: Self-pay

## 2021-04-09 ENCOUNTER — Ambulatory Visit (HOSPITAL_COMMUNITY)
Admission: RE | Admit: 2021-04-09 | Discharge: 2021-04-09 | Disposition: A | Discharge status: Home / Self Care | Payer: Medicaid Other | Source: Ambulatory Visit | Attending: Urology | Admitting: Urology

## 2021-04-09 DIAGNOSIS — Z87440 Personal history of urinary (tract) infections: Secondary | ICD-10-CM | POA: Insufficient documentation

## 2021-04-09 DIAGNOSIS — N390 Urinary tract infection, site not specified: Secondary | ICD-10-CM | POA: Diagnosis not present

## 2021-04-28 IMAGING — DX DG CHEST 1V PORT
1 series · 1 of 1 positions shown · non-contrast
Comparison: November 22, 2011

CLINICAL DATA: Cough.

EXAM:
PORTABLE CHEST 1 VIEW

[chest ap]
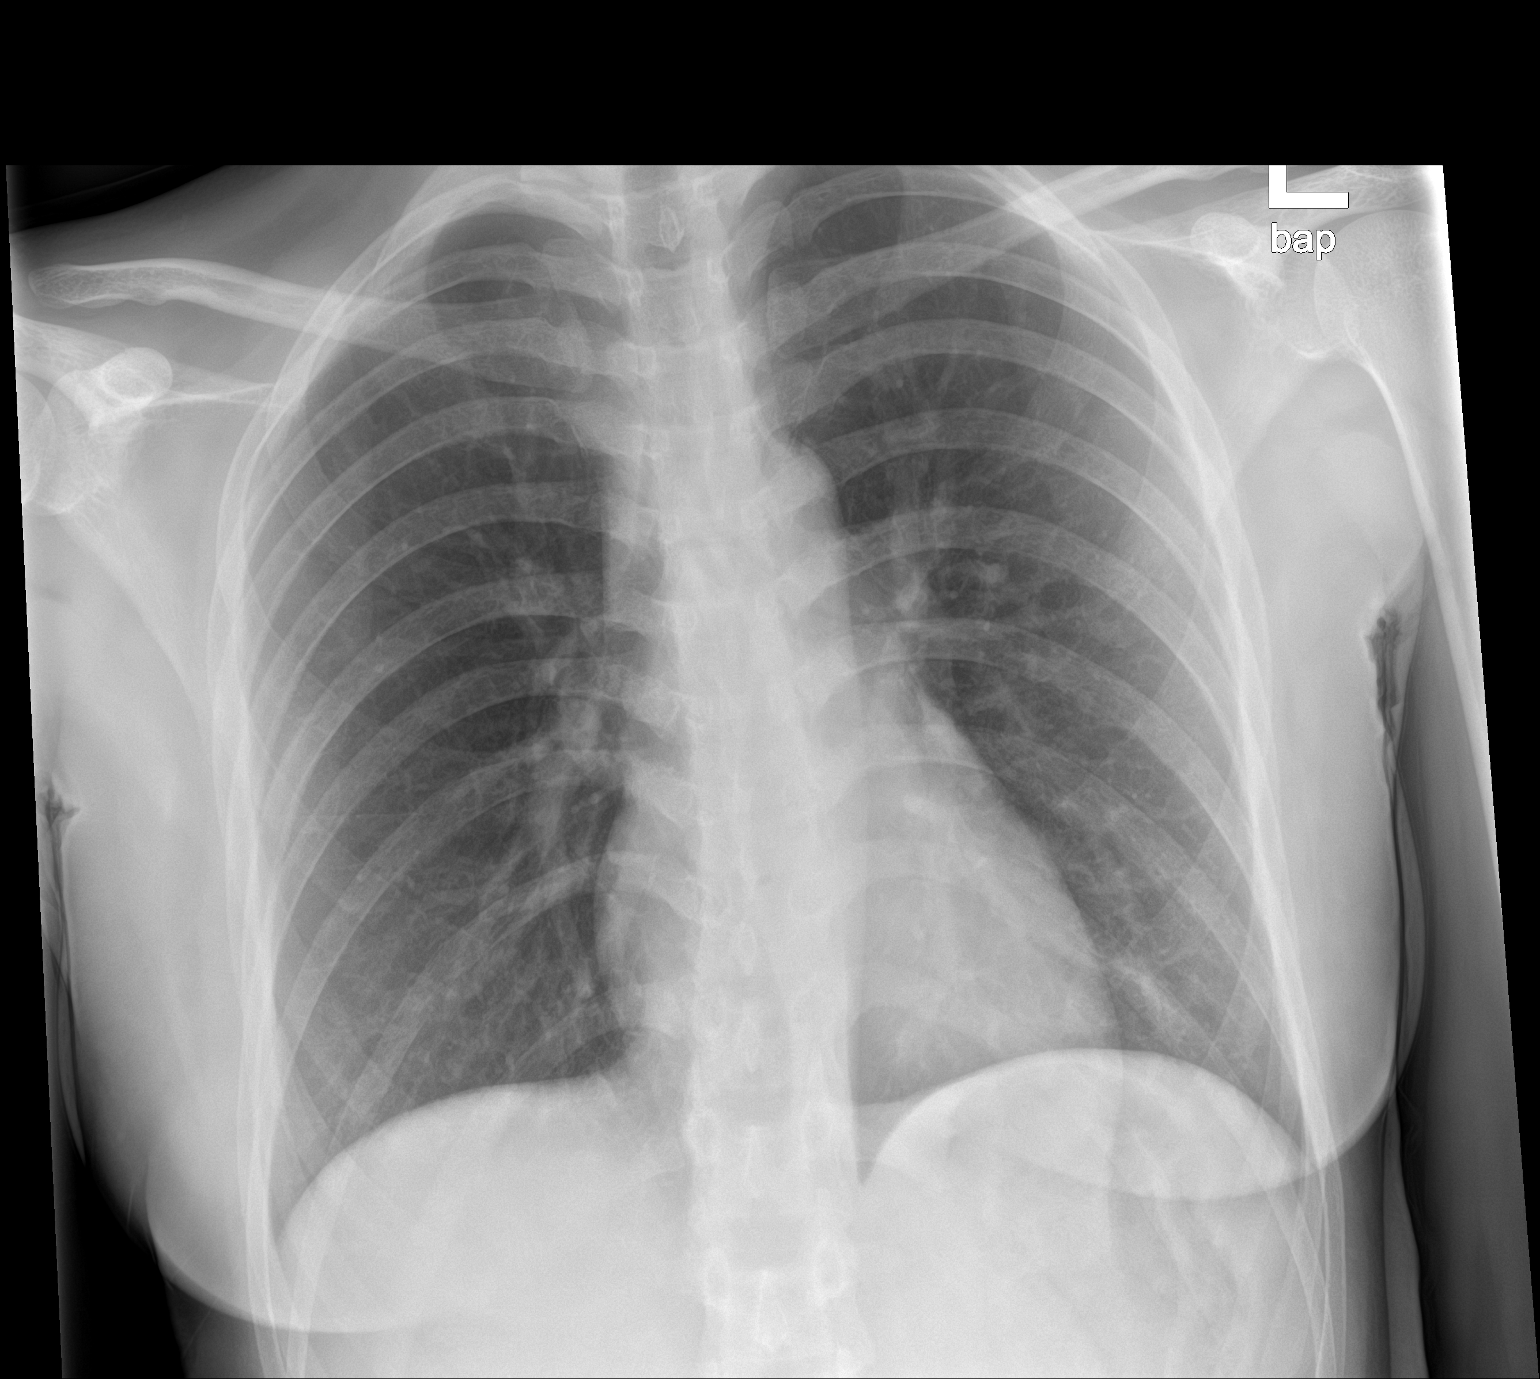

[1 of 1 positions shown; findings below may reference images not displayed]

FINDINGS: Cardiomediastinal silhouette is normal. Mediastinal contours appear
intact.

Minimal airspace consolidation the left lower lung field.

Osseous structures are without acute abnormality. Soft tissues are
grossly normal.
IMPRESSION: Subtle airspace consolidation in the left lower lung field may
represent early pneumonia.

## 2021-05-05 ENCOUNTER — Encounter: Payer: Self-pay | Admitting: Urology

## 2021-05-05 ENCOUNTER — Ambulatory Visit (INDEPENDENT_AMBULATORY_CARE_PROVIDER_SITE_OTHER): Payer: Medicaid Other | Admitting: Urology

## 2021-05-05 ENCOUNTER — Other Ambulatory Visit: Payer: Self-pay

## 2021-05-05 DIAGNOSIS — R350 Frequency of micturition: Secondary | ICD-10-CM

## 2021-05-05 DIAGNOSIS — Z87440 Personal history of urinary (tract) infections: Secondary | ICD-10-CM

## 2021-05-05 NOTE — Patient Instructions (Signed)
Urinary Tract Infection, Adult A urinary tract infection (UTI) is an infection of any part of the urinary tract. The urinary tract includes the kidneys, ureters, bladder, and urethra. These organs make, store, and get rid of urine in the body. An upper UTI affects the ureters and kidneys. A lower UTI affects the bladder and urethra. What are the causes? Most urinary tract infections are caused by bacteria in your genital area around your urethra, where urine leaves your body. These bacteria grow and cause inflammation of your urinary tract. What increases the risk? You are more likely to develop this condition if: You have a urinary catheter that stays in place. You are not able to control when you urinate or have a bowel movement (incontinence). You are female and you: Use a spermicide or diaphragm for birth control. Have low estrogen levels. Are pregnant. You have certain genes that increase your risk. You are sexually active. You take antibiotic medicines. You have a condition that causes your flow of urine to slow down, such as: An enlarged prostate, if you are female. Blockage in your urethra. A kidney stone. A nerve condition that affects your bladder control (neurogenic bladder). Not getting enough to drink, or not urinating often. You have certain medical conditions, such as: Diabetes. A weak disease-fighting system (immunesystem). Sickle cell disease. Gout. Spinal cord injury. What are the signs or symptoms? Symptoms of this condition include: Needing to urinate right away (urgency). Frequent urination. This may include small amounts of urine each time you urinate. Pain or burning with urination. Blood in the urine. Urine that smells bad or unusual. Trouble urinating. Cloudy urine. Vaginal discharge, if you are female. Pain in the abdomen or the lower back. You may also have: Vomiting or a decreased appetite. Confusion. Irritability or tiredness. A fever or  chills. Diarrhea. The first symptom in older adults may be confusion. In some cases, they may not have any symptoms until the infection has worsened. How is this diagnosed? This condition is diagnosed based on your medical history and a physical exam. You may also have other tests, including: Urine tests. Blood tests. Tests for STIs (sexually transmitted infections). If you have had more than one UTI, a cystoscopy or imaging studies may be done to determine the cause of the infections. How is this treated? Treatment for this condition includes: Antibiotic medicine. Over-the-counter medicines to treat discomfort. Drinking enough water to stay hydrated. If you have frequent infections or have other conditions such as a kidney stone, you may need to see a health care provider who specializes in the urinary tract (urologist). In rare cases, urinary tract infections can cause sepsis. Sepsis is a life-threatening condition that occurs when the body responds to an infection. Sepsis is treated in the hospital with IV antibiotics, fluids, and other medicines. Follow these instructions at home: Medicines Take over-the-counter and prescription medicines only as told by your health care provider. If you were prescribed an antibiotic medicine, take it as told by your health care provider. Do not stop using the antibiotic even if you start to feel better. General instructions Make sure you: Empty your bladder often and completely. Do not hold urine for long periods of time. Empty your bladder after sex. Wipe from front to back after urinating or having a bowel movement if you are female. Use each tissue only one time when you wipe. Drink enough fluid to keep your urine pale yellow. Keep all follow-up visits. This is important. Contact a health care provider   if: Your symptoms do not get better after 1-2 days. Your symptoms go away and then return. Get help right away if: You have severe pain in your  back or your lower abdomen. You have a fever or chills. You have nausea or vomiting. Summary A urinary tract infection (UTI) is an infection of any part of the urinary tract, which includes the kidneys, ureters, bladder, and urethra. Most urinary tract infections are caused by bacteria in your genital area. Treatment for this condition often includes antibiotic medicines. If you were prescribed an antibiotic medicine, take it as told by your health care provider. Do not stop using the antibiotic even if you start to feel better. Keep all follow-up visits. This is important. This information is not intended to replace advice given to you by your health care provider. Make sure you discuss any questions you have with your health care provider. Document Revised: 01/18/2020 Document Reviewed: 01/18/2020 Elsevier Patient Education  2022 Elsevier Inc.  
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
## 2021-06-08 ENCOUNTER — Encounter: Payer: Self-pay | Admitting: Obstetrics and Gynecology

## 2021-06-21 IMAGING — US US OB < 14 WEEKS - US OB TV
1 series · 15 of 28 positions shown · non-contrast
Comparison: None available.

CLINICAL DATA: Initial evaluation for acute pain for 2 days, early
pregnancy.

EXAM:
OBSTETRIC <14 WK US AND TRANSVAGINAL OB US
TECHNIQUE: Both transabdominal and transvaginal ultrasound examinations were
performed for complete evaluation of the gestation as well as the
maternal uterus, adnexal regions, and pelvic cul-de-sac.
Transvaginal technique was performed to assess early pregnancy.

[Series 1: us ob < 14 weeks - us ob tv · 15 of 65 slices shown]
[im 1/65]
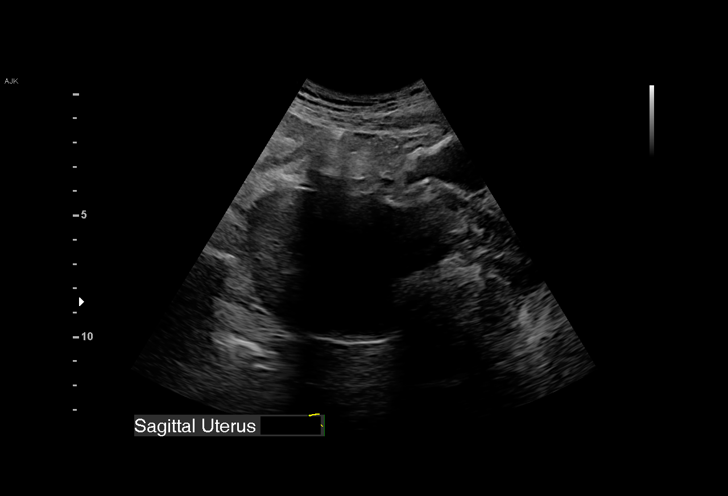
[im 5/65]
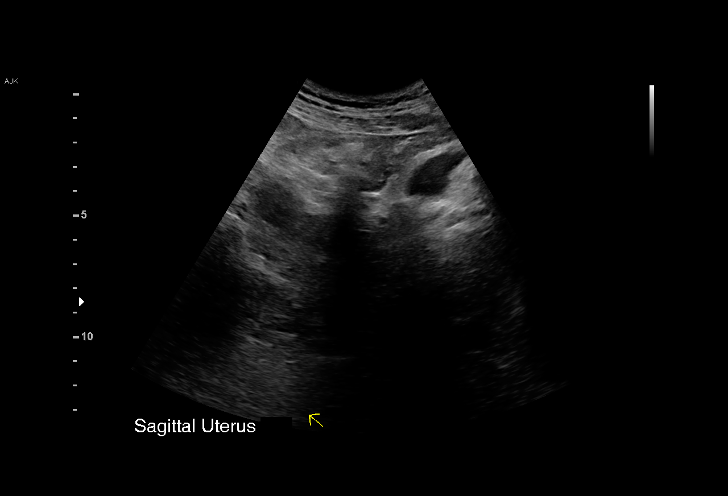
[im 10/65]
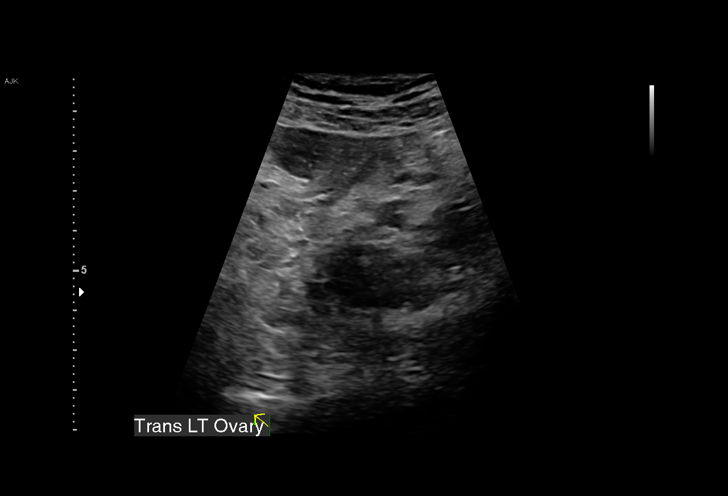
[im 15/65]
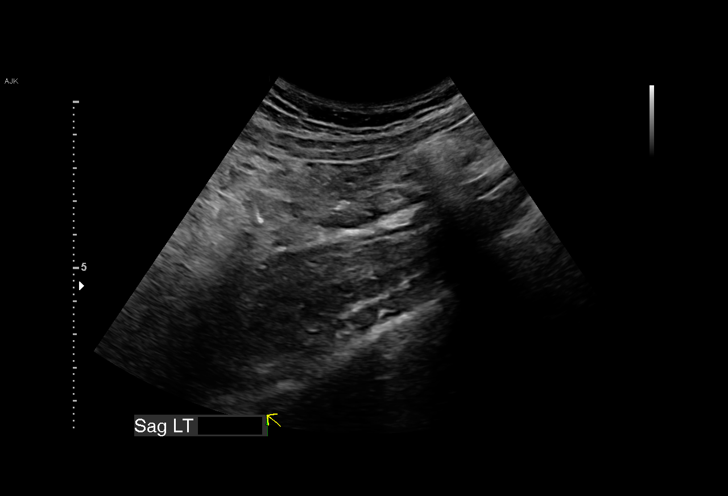
[im 19/65]
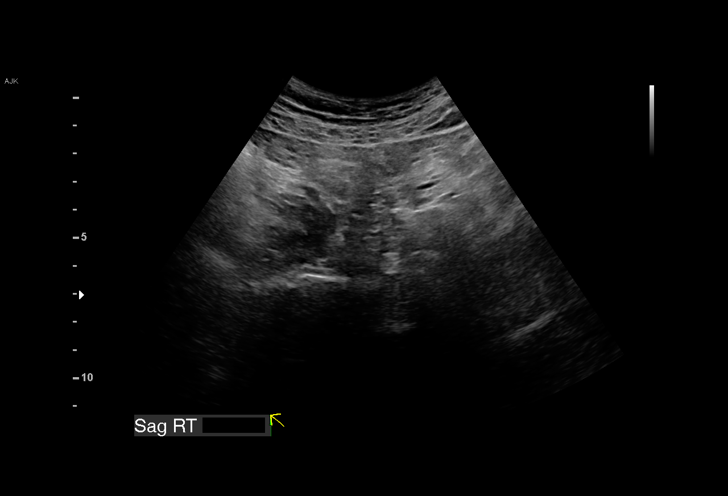
[im 24/65]
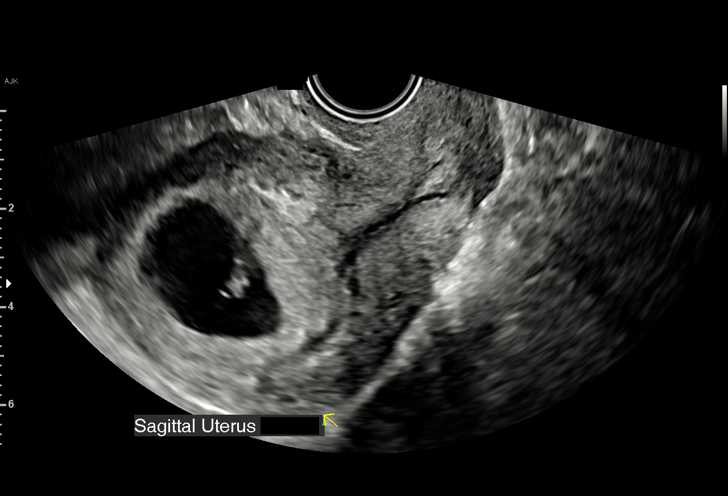
[im 29/65]
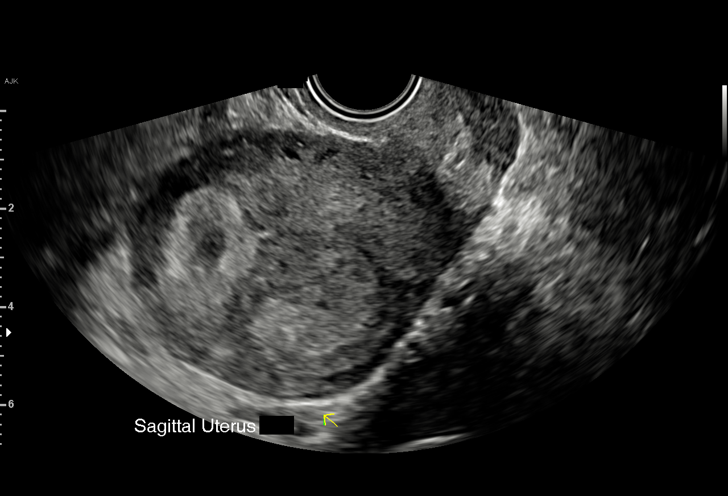
[im 34/65]
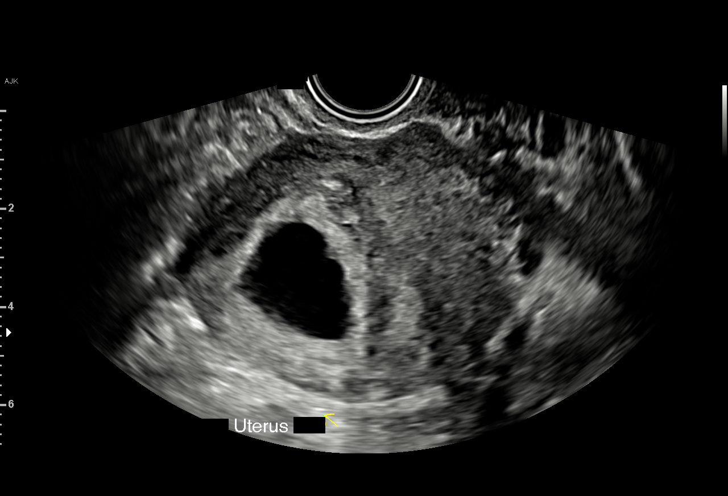
[im 36/65]
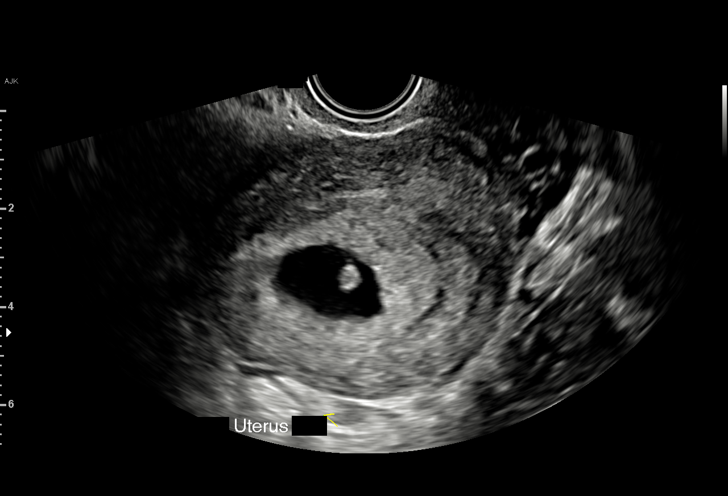
[im 41/65]
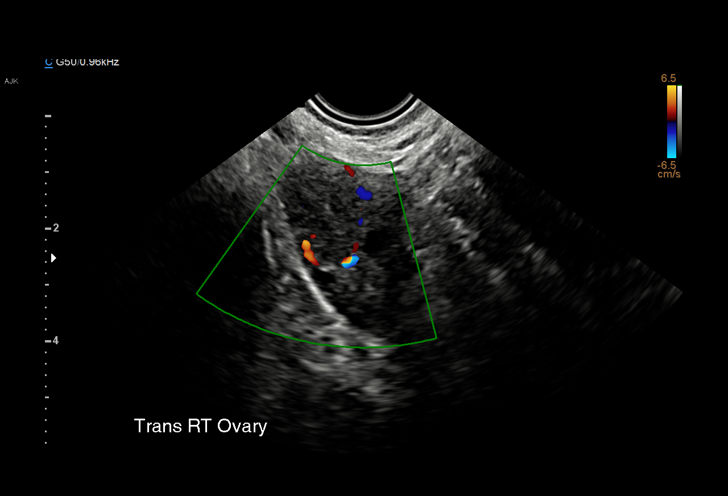
[im 46/65]
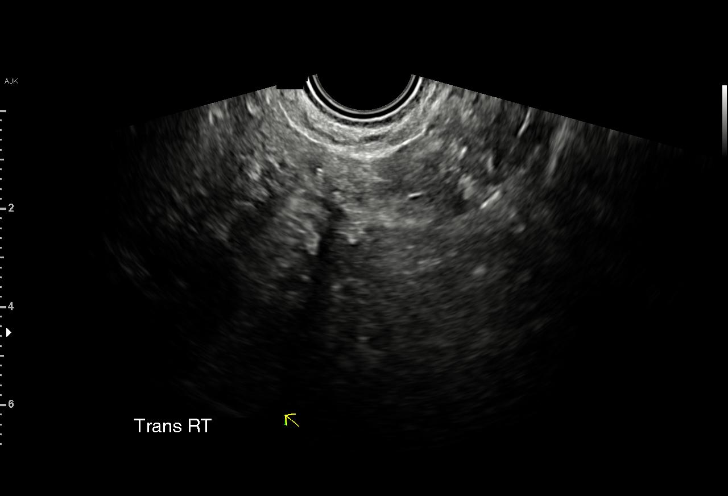
[im 50/65]
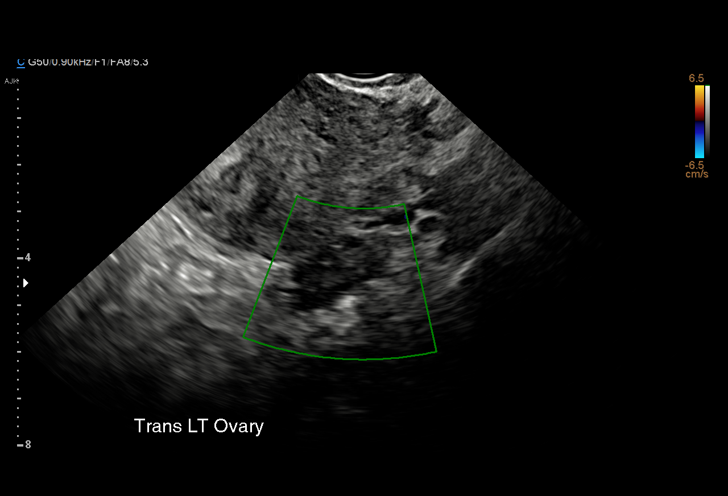
[im 55/65]
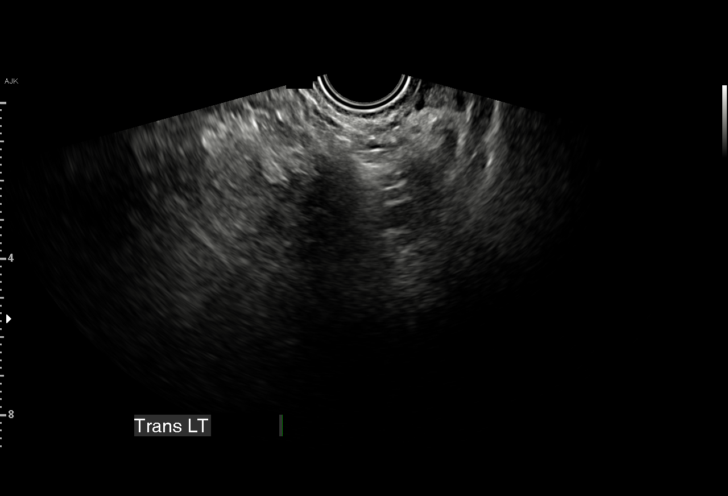
[im 60/65]
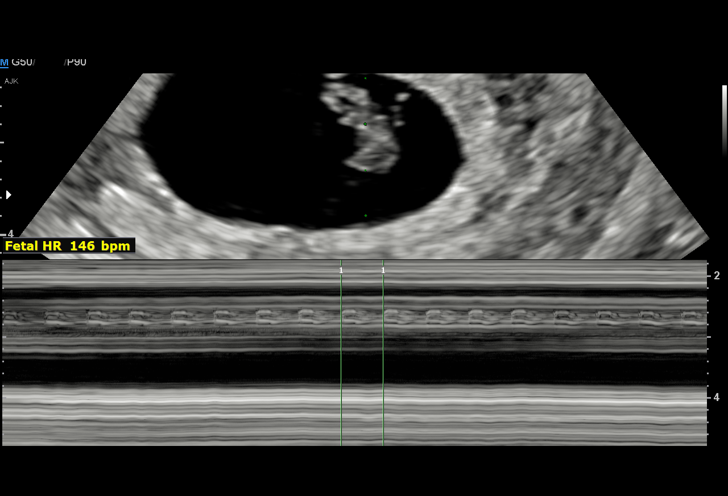
[im 65/65]
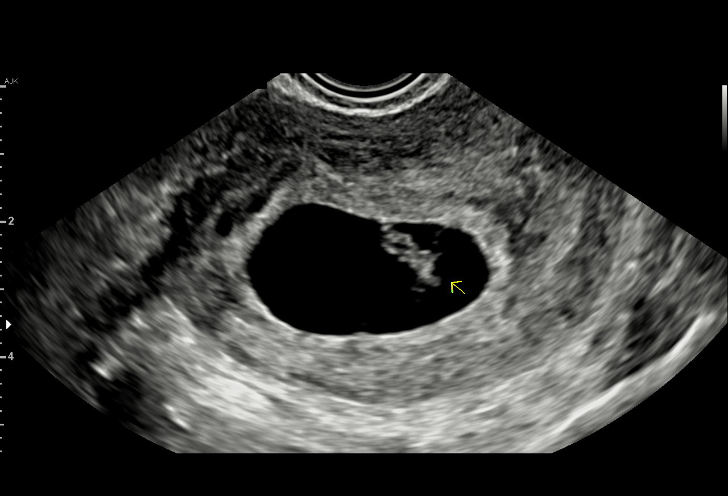

[15 of 28 positions shown; findings below may reference images not displayed]

FINDINGS: Intrauterine gestational sac: Single

Yolk sac:  Present

Embryo:  Present

Cardiac Activity: Present

Heart Rate: 146 bpm

CRL: 12.4 mm   7 w   3 d                  US EDC: 02/09/2021

Subchorionic hemorrhage:  None visualized.

Maternal uterus/adnexae: Ovaries within normal limits. Corpus luteal
cyst noted on the right. No adnexal mass or free fluid.
IMPRESSION: 1. Single viable intrauterine pregnancy as above, estimated
gestational age 7 weeks and 3 days by crown-rump length, with
ultrasound EDC of 02/09/2021. No complication.
2. No other acute maternal uterine or adnexal abnormality.

## 2021-07-31 ENCOUNTER — Ambulatory Visit: Payer: Medicaid Other | Admitting: Nurse Practitioner

## 2021-08-10 ENCOUNTER — Encounter: Payer: Self-pay | Admitting: Nurse Practitioner

## 2021-08-10 ENCOUNTER — Other Ambulatory Visit: Payer: Self-pay

## 2021-08-10 ENCOUNTER — Ambulatory Visit: Payer: Medicaid Other | Admitting: Nurse Practitioner

## 2021-08-10 VITALS — BP 103/74 | HR 99 | Temp 97.9°F | Ht 63.0 in | Wt 109.2 lb

## 2021-08-10 DIAGNOSIS — Z09 Encounter for follow-up examination after completed treatment for conditions other than malignant neoplasm: Secondary | ICD-10-CM | POA: Diagnosis not present

## 2021-08-10 NOTE — Patient Instructions (Signed)
Preventive Care 26-26 Years Old, Female °Preventive care refers to lifestyle choices and visits with your health care provider that can promote health and wellness. Preventive care visits are also called wellness exams. °What can I expect for my preventive care visit? °Counseling °During your preventive care visit, your health care provider may ask about your: °Medical history, including: °Past medical problems. °Family medical history. °Pregnancy history. °Current health, including: °Menstrual cycle. °Method of birth control. °Emotional well-being. °Home life and relationship well-being. °Sexual activity and sexual health. °Lifestyle, including: °Alcohol, nicotine or tobacco, and drug use. °Access to firearms. °Diet, exercise, and sleep habits. °Work and work environment. °Sunscreen use. °Safety issues such as seatbelt and bike helmet use. °Physical exam °Your health care provider may check your: °Height and weight. These may be used to calculate your BMI (body mass index). BMI is a measurement that tells if you are at a healthy weight. °Waist circumference. This measures the distance around your waistline. This measurement also tells if you are at a healthy weight and may help predict your risk of certain diseases, such as type 2 diabetes and high blood pressure. °Heart rate and blood pressure. °Body temperature. °Skin for abnormal spots. °What immunizations do I need? °Vaccines are usually given at various ages, according to a schedule. Your health care provider will recommend vaccines for you based on your age, medical history, and lifestyle or other factors, such as travel or where you work. °What tests do I need? °Screening °Your health care provider may recommend screening tests for certain conditions. This may include: °Pelvic exam and Pap test. °Lipid and cholesterol levels. °Diabetes screening. This is done by checking your blood sugar (glucose) after you have not eaten for a while (fasting). °Hepatitis B  test. °Hepatitis C test. °HIV (human immunodeficiency virus) test. °STI (sexually transmitted infection) testing, if you are at risk. °BRCA-related cancer screening. This may be done if you have a family history of breast, ovarian, tubal, or peritoneal cancers. °Talk with your health care provider about your test results, treatment options, and if necessary, the need for more tests. °Follow these instructions at home: °Eating and drinking ° °Eat a healthy diet that includes fresh fruits and vegetables, whole grains, lean protein, and low-fat dairy products. °Take vitamin and mineral supplements as recommended by your health care provider. °Do not drink alcohol if: °Your health care provider tells you not to drink. °You are pregnant, may be pregnant, or are planning to become pregnant. °If you drink alcohol: °Limit how much you have to 0-1 drink a day. °Know how much alcohol is in your drink. In the [place] one drink equals one 12 oz bottle of beer (355 mL), one 5 oz glass of wine (148 mL), or one 1½ oz glass of hard liquor (44 mL). °Lifestyle °Brush your teeth every morning and night with fluoride toothpaste. Floss one time each day. °Exercise for at least 30 minutes 5 or more days each week. °Do not use any products that contain nicotine or tobacco. These products include cigarettes, chewing tobacco, and vaping devices, such as e-cigarettes. If you need help quitting, ask your health care provider. °Do not use drugs. °If you are sexually active, practice safe sex. Use a condom or other form of protection to prevent STIs. °If you do not wish to become pregnant, use a form of birth control. If you plan to become pregnant, see your health care provider for a prepregnancy visit. °Find healthy ways to manage stress, such as: °Meditation, yoga,   or listening to music. °Journaling. °Talking to a trusted person. °Spending time with friends and family. °Minimize exposure to UV radiation to reduce your risk of skin  cancer. °Safety °Always wear your seat belt while driving or riding in a vehicle. °Do not drive: °If you have been drinking alcohol. Do not ride with someone who has been drinking. °If you have been using any mind-altering substances or drugs. °While texting. °When you are tired or distracted. °Wear a helmet and other protective equipment during sports activities. °If you have firearms in your house, make sure you follow all gun safety procedures. °Seek help if you have been physically or sexually abused. °What's next? °Go to your health care provider once a year for an annual wellness visit. °Ask your health care provider how often you should have your eyes and teeth checked. °Stay up to date on all vaccines. °This information is not intended to replace advice given to you by your health care provider. Make sure you discuss any questions you have with your health care provider. °Document Revised: 12/03/2020 Document Reviewed: 12/03/2020 °Elsevier Patient Education © 2022 [employer]vier Inc. ° °
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
## 2021-08-12 ENCOUNTER — Ambulatory Visit (INDEPENDENT_AMBULATORY_CARE_PROVIDER_SITE_OTHER): Payer: Medicaid Other | Admitting: Obstetrics & Gynecology

## 2021-08-12 ENCOUNTER — Encounter: Payer: Self-pay | Admitting: Obstetrics & Gynecology

## 2021-08-12 ENCOUNTER — Other Ambulatory Visit (HOSPITAL_COMMUNITY)
Admission: RE | Admit: 2021-08-12 | Discharge: 2021-08-12 | Disposition: A | Discharge status: Home / Self Care | Payer: Medicaid Other | Source: Ambulatory Visit | Attending: Obstetrics & Gynecology | Admitting: Obstetrics & Gynecology

## 2021-08-12 ENCOUNTER — Other Ambulatory Visit: Payer: Self-pay

## 2021-08-12 VITALS — BP 134/90 | HR 101 | Wt 108.9 lb

## 2021-08-12 DIAGNOSIS — N898 Other specified noninflammatory disorders of vagina: Secondary | ICD-10-CM | POA: Insufficient documentation

## 2021-08-12 DIAGNOSIS — B3731 Acute candidiasis of vulva and vagina: Secondary | ICD-10-CM | POA: Diagnosis not present

## 2021-08-12 MED ORDER — TERCONAZOLE 0.4 % VA CREA: 1.0000 | TOPICAL_CREAM | Freq: Every day | VAGINAL | 0 refills | Status: DC
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
## 2021-08-13 LAB — CERVICOVAGINAL ANCILLARY ONLY
Bacterial Vaginitis (gardnerella): NEGATIVE
Candida Glabrata: NEGATIVE
Candida Vaginitis: NEGATIVE
Chlamydia: NEGATIVE
Comment: NEGATIVE
Comment: NEGATIVE
Comment: NEGATIVE
Comment: NEGATIVE
Comment: NEGATIVE
Comment: NORMAL
Neisseria Gonorrhea: NEGATIVE
Trichomonas: NEGATIVE

## 2021-08-19 ENCOUNTER — Other Ambulatory Visit: Payer: Self-pay | Admitting: Nurse Practitioner

## 2021-08-19 DIAGNOSIS — Q524 Other congenital malformations of vagina: Secondary | ICD-10-CM

## 2021-08-26 ENCOUNTER — Telehealth: Payer: Self-pay

## 2021-08-28 NOTE — Telephone Encounter (Signed)
No additional notes needed  

## 2021-09-08 ENCOUNTER — Emergency Department (HOSPITAL_COMMUNITY)
Admission: EM | Admit: 2021-09-08 | Discharge: 2021-09-09 | Disposition: A | Discharge status: Home / Self Care | Payer: Medicaid Other | Attending: Emergency Medicine | Admitting: Emergency Medicine

## 2021-09-08 ENCOUNTER — Other Ambulatory Visit: Payer: Self-pay

## 2021-09-08 DIAGNOSIS — R519 Headache, unspecified: Secondary | ICD-10-CM | POA: Diagnosis present

## 2021-09-08 DIAGNOSIS — J111 Influenza due to unidentified influenza virus with other respiratory manifestations: Secondary | ICD-10-CM

## 2021-09-08 DIAGNOSIS — M791 Myalgia, unspecified site: Secondary | ICD-10-CM | POA: Diagnosis not present

## 2021-09-08 DIAGNOSIS — R0981 Nasal congestion: Secondary | ICD-10-CM | POA: Insufficient documentation

## 2021-09-08 DIAGNOSIS — Z20822 Contact with and (suspected) exposure to covid-19: Secondary | ICD-10-CM | POA: Insufficient documentation

## 2021-09-08 DIAGNOSIS — R112 Nausea with vomiting, unspecified: Secondary | ICD-10-CM | POA: Diagnosis not present

## 2021-09-08 DIAGNOSIS — R509 Fever, unspecified: Secondary | ICD-10-CM | POA: Insufficient documentation

## 2021-09-08 DIAGNOSIS — H9203 Otalgia, bilateral: Secondary | ICD-10-CM | POA: Insufficient documentation

## 2021-09-08 DIAGNOSIS — D72829 Elevated white blood cell count, unspecified: Secondary | ICD-10-CM | POA: Insufficient documentation

## 2021-09-08 DIAGNOSIS — R000 Tachycardia, unspecified: Secondary | ICD-10-CM | POA: Insufficient documentation

## 2021-09-08 DIAGNOSIS — R42 Dizziness and giddiness: Secondary | ICD-10-CM | POA: Diagnosis not present

## 2021-09-09 ENCOUNTER — Emergency Department (HOSPITAL_COMMUNITY): Payer: Medicaid Other

## 2021-09-09 ENCOUNTER — Other Ambulatory Visit: Payer: Self-pay

## 2021-09-09 ENCOUNTER — Encounter (HOSPITAL_COMMUNITY): Payer: Self-pay

## 2021-09-09 LAB — COMPREHENSIVE METABOLIC PANEL
ALT: 15 U/L (ref 0–44)
AST: 17 U/L (ref 15–41)
Albumin: 4.1 g/dL (ref 3.5–5.0)
Alkaline Phosphatase: 96 U/L (ref 38–126)
Anion gap: 11 (ref 5–15)
BUN: 9 mg/dL (ref 6–20)
CO2: 21 mmol/L — ABNORMAL LOW (ref 22–32)
Calcium: 8.8 mg/dL — ABNORMAL LOW (ref 8.9–10.3)
Chloride: 102 mmol/L (ref 98–111)
Creatinine, Ser: 0.92 mg/dL (ref 0.44–1.00)
GFR, Estimated: >60 mL/min (ref >60)
Glucose, Bld: 110 mg/dL — ABNORMAL HIGH (ref 70–99)
Potassium: 3.4 mmol/L — ABNORMAL LOW (ref 3.5–5.1)
Sodium: 134 mmol/L — ABNORMAL LOW (ref 135–145)
Total Bilirubin: 1.4 mg/dL — ABNORMAL HIGH (ref 0.3–1.2)
Total Protein: 7.4 g/dL (ref 6.5–8.1)

## 2021-09-09 LAB — CBG MONITORING, ED: Glucose-Capillary: 118 mg/dL — ABNORMAL HIGH (ref 70–99)

## 2021-09-09 LAB — LIPASE, BLOOD: Lipase: 35 U/L (ref 11–51)

## 2021-09-09 LAB — RESP PANEL BY RT-PCR (FLU A&B, COVID) ARPGX2
Influenza A by PCR: NEGATIVE
Influenza B by PCR: NEGATIVE
SARS Coronavirus 2 by RT PCR: NEGATIVE

## 2021-09-09 LAB — CBC WITH DIFFERENTIAL/PLATELET
Abs Immature Granulocytes: 0.11 10*3/uL — ABNORMAL HIGH (ref 0.00–0.07)
Basophils Absolute: 0 10*3/uL (ref 0.0–0.1)
Basophils Relative: 0 %
Eosinophils Absolute: 0 10*3/uL (ref 0.0–0.5)
Eosinophils Relative: 0 %
HCT: 41.3 % (ref 36.0–46.0)
Hemoglobin: 14.1 g/dL (ref 12.0–15.0)
Immature Granulocytes: 1 %
Lymphocytes Relative: 4 %
Lymphs Abs: 0.6 10*3/uL — ABNORMAL LOW (ref 0.7–4.0)
MCH: 30.5 pg (ref 26.0–34.0)
MCHC: 34.1 g/dL (ref 30.0–36.0)
MCV: 89.4 fL (ref 80.0–100.0)
Monocytes Absolute: 0.8 10*3/uL (ref 0.1–1.0)
Monocytes Relative: 5 %
Neutro Abs: 14.7 10*3/uL — ABNORMAL HIGH (ref 1.7–7.7)
Neutrophils Relative %: 90 %
Platelets: 200 10*3/uL (ref 150–400)
RBC: 4.62 MIL/uL (ref 3.87–5.11)
RDW: 12 % (ref 11.5–15.5)
WBC: 16.3 10*3/uL — ABNORMAL HIGH (ref 4.0–10.5)
nRBC: 0 % (ref 0.0–0.2)

## 2021-09-09 LAB — I-STAT BETA HCG BLOOD, ED (MC, WL, AP ONLY): I-stat hCG, quantitative: 7.5 m[IU]/mL — ABNORMAL HIGH (ref <5)

## 2021-09-09 LAB — URINALYSIS, ROUTINE W REFLEX MICROSCOPIC
Bilirubin Urine: NEGATIVE
Glucose, UA: NEGATIVE mg/dL
Hgb urine dipstick: NEGATIVE
Ketones, ur: 5 mg/dL — AB
Leukocytes,Ua: NEGATIVE
Nitrite: NEGATIVE
Protein, ur: NEGATIVE mg/dL
Specific Gravity, Urine: 1.01 (ref 1.005–1.030)
pH: 5 (ref 5.0–8.0)

## 2021-09-09 LAB — TROPONIN I (HIGH SENSITIVITY): Troponin I (High Sensitivity): 3 ng/L (ref <18)

## 2021-09-09 LAB — PREGNANCY, URINE: Preg Test, Ur: NEGATIVE

## 2021-09-09 LAB — GROUP A STREP BY PCR: Group A Strep by PCR: DETECTED — AB

## 2021-09-09 MED ORDER — SODIUM CHLORIDE 0.9 % IV BOLUS
2000.0000 mL | Freq: Once | INTRAVENOUS | Status: AC
  Administered 2021-09-09: 2000 mL via INTRAVENOUS

## 2021-09-09 MED ORDER — AMOXICILLIN-POT CLAVULANATE 875-125 MG PO TABS: 1.0000 | ORAL_TABLET | Freq: Two times a day (BID) | ORAL | 0 refills | Status: DC

## 2021-09-09 MED ORDER — NAPROXEN 500 MG PO TABS: 500.0000 mg | ORAL_TABLET | Freq: Two times a day (BID) | ORAL | 0 refills | Status: DC | PRN

## 2021-09-09 MED ORDER — ONDANSETRON 4 MG PO TBDP: 4.0000 mg | ORAL_TABLET | Freq: Three times a day (TID) | ORAL | 0 refills | Status: DC | PRN

## 2021-09-09 MED ORDER — KETOROLAC TROMETHAMINE 15 MG/ML IJ SOLN
15.0000 mg | Freq: Once | INTRAMUSCULAR | Status: AC
  Administered 2021-09-09: 15 mg via INTRAVENOUS
  Filled 2021-09-09: qty 1

## 2021-09-09 MED ORDER — AMOXICILLIN 875 MG PO TABS: 875.0000 mg | ORAL_TABLET | Freq: Two times a day (BID) | ORAL | 0 refills | Status: DC

## 2021-09-09 MED ORDER — MECLIZINE HCL 12.5 MG PO TABS
12.5000 mg | ORAL_TABLET | Freq: Three times a day (TID) | ORAL | 0 refills | Status: DC | PRN
Start: 2021-09-09 — End: 2022-05-10

## 2021-09-09 MED ORDER — ONDANSETRON HCL 4 MG/2ML IJ SOLN
4.0000 mg | Freq: Once | INTRAMUSCULAR | Status: AC
Start: 2021-09-09 — End: 2021-09-09
  Administered 2021-09-09: 4 mg via INTRAVENOUS
  Filled 2021-09-09: qty 2

## 2021-09-09 MED ORDER — IOHEXOL 350 MG/ML SOLN
75.0000 mL | Freq: Once | INTRAVENOUS | Status: AC | PRN
Start: 2021-09-09 — End: 2021-09-09
  Administered 2021-09-09: 75 mL via INTRAVENOUS

## 2021-09-09 MED ORDER — MECLIZINE HCL 25 MG PO TABS
25.0000 mg | ORAL_TABLET | Freq: Once | ORAL | Status: AC
  Administered 2021-09-09: 25 mg via ORAL
  Filled 2021-09-09 (×2): qty 1

## 2021-09-09 NOTE — ED Provider Notes (Signed)
?[facility] EMERGENCY DEPARTMENT ?Provider Note ? ? ?CSN: [id number] ?Arrival date & time: 09/08/21  2341 ? ?  ? ?History ? ?Chief Complaint  ?Patient presents with  ? Vomiting  ? ? ?Barbara Mckinney is a 26 y.o. female who presents to the ED with complaints of flu like sxs since last night. Patient reports headache (generalized, gradual onset, steady progression), fever, chills, generalized body aches, congestion, ear pain (bilateral-R>L), nausea, and vomiting.  States she had some chest and abdominal pain earlier but this is resolved.  Tonight she woke up after taking Tylenol and NyQuil before bed and felt worse with some dizziness like the room spinning, dizziness improved when she stays still but worse with position changes especially if she stands up.  She denies loss of consciousness.  States she has been having more frequent headaches recently over the past several months, but denies history of migraines.  She denies double or blurry vision, unilateral numbness/weakness, syncope, hematemesis, diarrhea, current abdominal pain, shortness of breath, or cough.  She states she has had some of the symptoms in the past when she was pregnant. ? ?HPI ? ?  ? ?Home Medications ?Prior to Admission medications   ?Medication Sig Start Date End Date Taking? Authorizing Provider  ?terconazole (TERAZOL 7) 0.4 % vaginal cream Place 1 applicator vaginally at bedtime. 08/12/21   Adam Phenix, MD  ?   ? ?Allergies    ?Bacitracin   ? ?Review of Systems   ?Review of Systems  ?Constitutional:  Positive for chills, fatigue and fever.  ?HENT:  Positive for congestion and ear pain.   ?Eyes:  Negative for visual disturbance.  ?Respiratory:  Negative for cough and shortness of breath.   ?Cardiovascular:  Positive for chest pain.  ?Gastrointestinal:  Positive for abdominal pain, nausea and vomiting. Negative for blood in stool and diarrhea.  ?Neurological:  Positive for dizziness and headaches. Negative for syncope,  weakness and numbness.  ?All other systems reviewed and are negative. ? ?Physical Exam ?Updated Vital Signs ?BP 107/79 (BP Location: Right Arm)   Pulse (!) 120   Temp 98.7 ?F (37.1 ?C) (Oral)   Resp 19   Ht 5\' 3"  (1.6 m)   Wt 51.7 kg   LMP 07/29/2021   SpO2 99%   BMI 20.19 kg/m?  ?Physical Exam ?Vitals and nursing note reviewed.  ?Constitutional:   ?   General: She is not in acute distress. ?   Appearance: She is well-developed. She is not toxic-appearing.  ?HENT:  ?   Head: Normocephalic and atraumatic.  ?   Right Ear: Tympanic membrane is erythematous. Tympanic membrane is not perforated, retracted or bulging.  ?   Left Ear: Tympanic membrane is not perforated, erythematous, retracted or bulging.  ?   Ears:  ?   Comments: Fullness to bilateral Tms.  ?No mastoid erythema/swelling.  ? ?   Nose: Congestion present.  ?   Mouth/Throat:  ?   Mouth: Mucous membranes are dry.  ?   Pharynx: Uvula midline. Posterior oropharyngeal erythema present. No oropharyngeal exudate.  ?   Comments: Posterior oropharynx is symmetric appearing. Patient tolerating own secretions without difficulty. No trismus. No drooling. No hot potato voice. No swelling beneath the tongue, submandibular compartment is soft.  ?Eyes:  ?   General: Vision grossly intact. Gaze aligned appropriately.     ?   Right eye: No discharge.     ?   Left eye: No discharge.  ?   Extraocular Movements:  Extraocular movements intact.  ?   Conjunctiva/sclera: Conjunctivae normal.  ?   Comments: PERRL. No proptosis.  No rotational or vertical nystagmus.  ?Cardiovascular:  ?   Rate and Rhythm: Regular rhythm. Tachycardia present.  ?Pulmonary:  ?   Effort: Pulmonary effort is normal. No respiratory distress.  ?   Breath sounds: Normal breath sounds. No wheezing, rhonchi or rales.  ?Abdominal:  ?   General: There is no distension.  ?   Palpations: Abdomen is soft.  ?   Tenderness: There is no abdominal tenderness.  ?Musculoskeletal:  ?   Cervical back: Normal range  of motion and neck supple. No rigidity.  ?Skin: ?   General: Skin is warm and dry.  ?   Findings: No rash.  ?Neurological:  ?   Comments: Alert. Clear speech. No facial droop. CNIII-XII grossly intact. Bilateral upper and lower extremities' sensation grossly intact. 5/5 symmetric strength with grip strength and with plantar and dorsi flexion bilaterally . Normal finger to nose bilaterally.  Unsteady with standing.  ?Psychiatric:     ?   Behavior: Behavior normal.  ? ? ?ED Results / Procedures / Treatments   ?Labs ?(all labs ordered are listed, but only abnormal results are displayed) ?Labs Reviewed  ?COMPREHENSIVE METABOLIC PANEL - Abnormal; Notable for the following components:  ?    Result Value  ? Sodium 134 (*)   ? Potassium 3.4 (*)   ? CO2 21 (*)   ? Glucose, Bld 110 (*)   ? Calcium 8.8 (*)   ? Total Bilirubin 1.4 (*)   ? All other components within normal limits  ?CBC WITH DIFFERENTIAL/PLATELET - Abnormal; Notable for the following components:  ? WBC 16.3 (*)   ? Neutro Abs 14.7 (*)   ? Lymphs Abs 0.6 (*)   ? Abs Immature Granulocytes 0.11 (*)   ? All other components within normal limits  ?URINALYSIS, ROUTINE W REFLEX MICROSCOPIC - Abnormal; Notable for the following components:  ? Ketones, ur 5 (*)   ? All other components within normal limits  ?I-STAT BETA HCG BLOOD, ED ([facility], [facility], [facility] ONLY) - Abnormal; Notable for the following components:  ? I-stat hCG, quantitative 7.5 (*)   ? All other components within normal limits  ?CBG MONITORING, ED - Abnormal; Notable for the following components:  ? Glucose-Capillary 118 (*)   ? All other components within normal limits  ?RESP PANEL BY RT-PCR (FLU A&B, COVID) ARPGX2  ?LIPASE, BLOOD  ?PREGNANCY, URINE  ? ? ?EKG ?EKG Interpretation ? ?Date/Time:  Wednesday September 09 2021 00:06:34 EDT ?Ventricular Rate:  118 ?PR Interval:  148 ?QRS Duration: 70 ?QT Interval:  308 ?QTC Calculation: 431 ?R Axis:   92 ?Text Interpretation: Sinus tachycardia Rightward axis T wave  abnormality, consider inferior ischemia Abnormal ECG When compared with ECG of 25-Jun-2013 15:30, PREVIOUS ECG IS PRESENT Confirmed by Kennis CarinaBero, Michael [phone]([id number]) on 09/09/2021 3:03:19 AM ? ?Radiology ?CT ANGIO HEAD NECK W WO CM ? ?Result Date: 09/09/2021 ?CLINICAL DATA:  Subarachnoid hemorrhage Headaches, congestion, nausea, fever, and chills. EXAM: CT ANGIOGRAPHY HEAD AND NECK TECHNIQUE: Multidetector CT imaging of the head and neck was performed using the standard protocol during bolus administration of intravenous contrast. Multiplanar CT image reconstructions and MIPs were obtained to evaluate the vascular anatomy. Carotid stenosis measurements (when applicable) are obtained utilizing NASCET criteria, using the distal internal carotid diameter as the denominator. RADIATION DOSE REDUCTION: This exam was performed according to the departmental dose-optimization program which includes automated exposure control,  adjustment of the mA and/or kV according to patient size and/or use of iterative reconstruction technique. CONTRAST:  52mL OMNIPAQUE IOHEXOL 350 MG/ML SOLN COMPARISON:  None. FINDINGS: CT HEAD FINDINGS Brain: No evidence of acute infarction, hemorrhage, hydrocephalus, extra-axial collection or mass lesion/mass effect. Vascular: See below Skull: Normal. Negative for fracture or focal lesion. Sinuses: Clear Orbits: Negative Review of the MIP images confirms the above findings CTA NECK FINDINGS Aortic arch: Normal Right carotid system: Normal Left carotid system: Normal Vertebral arteries: Symmetric vertebral arteries with smooth, normal appearance Skeleton: Negative Other neck: Symmetric prominence of tonsillar enhancement and size of upper jugular lymph nodes. Upper chest: Negative Review of the MIP images confirms the above findings CTA HEAD FINDINGS Anterior circulation: No significant stenosis, proximal occlusion, aneurysm, or vascular malformation. Posterior circulation: No significant stenosis, proximal  occlusion, aneurysm, or vascular malformation. Venous sinuses: Diffuse venous contamination. The venous sinuses are diffusely patent. Anatomic variants: None significant. Review of the MIP images confirms the above findings IMPRESS

## 2021-09-09 NOTE — ED Triage Notes (Signed)
Nausea, vomiting, fever that started this am, some congestion as well. ?

## 2021-09-09 NOTE — ED Provider Triage Note (Signed)
**Note Barbara-Identified via Obfuscation** Emergency Medicine Provider Triage Evaluation Note ? ?Barbara Mckinney , a 26 y.o. female  was evaluated in triage.  Pt complains of dizziness.  Patient reports that yesterday she started to not feel well with headaches, congestion, nausea, abdominal discomfort, fever, chills, and body aches.  Today she took some Tylenol and NyQuil to go to bed however she woke up feeling worse with worsening body aches and dizziness like the room is spinning.  Dizziness is improved if she sits still, however when she tries to stand up this gets much worse.  She states this all feels similar to when she was pregnant previously.  Denies numbness, unilateral weakness, or change in vision.  ? ?Review of Systems  ?Per above ? ?Physical Exam  ?BP 107/79 (BP Location: Right Arm)   Pulse (!) 120   Temp 98.7 ?F (37.1 ?C) (Oral)   Resp 19   SpO2 98%  ?Gen:   Awake, no distress   ?Resp:  Normal effort  ?MSK:   Moves extremities without difficulty  ?Other:  Tachycardic.  Pupils equal round and reactive to light.  Extraocular movements intact without rotational or vertical nystagmus.  No facial droop.  Clear speech.  Sensation grossly intact bilateral upper and lower extremities.  5-5 symmetric grip strength and strength with plantar dorsiflexion bilaterally.  Intact finger-to-nose. ? ?Medical Decision Making  ?Medically screening exam initiated at 12:10 AM.  Appropriate orders placed.  Amritha Yorke was informed that the remainder of the evaluation will be completed by another provider, this initial triage assessment does not replace that evaluation, and the importance of remaining in the ED until their evaluation is complete. ? ?Flu like symptoms.  ?  Cherly Anderson, PA-C ?09/09/21 0012 ? ?

## 2021-09-09 NOTE — Discharge Instructions (Addendum)
You were seen in the emergency department tonight for fever, body aches, headaches, vomiting, and dizziness. ? ?Your emergency department work-up was overall reassuring.  Your white blood cell count was elevated which can be an indication of infection.  Your sodium, potassium, calcium were all very minimally low, please have this rechecked by your primary care provider ? ?Your CT scan did not show any acute abnormalities of your brain. ? ?Your chest x-ray did not show pneumonia. ? ?We are sending you home with the following medications to help with your symptoms: ?- Meclizine- take every 8 hours as needed for dizziness.  ?- Zofran- take every 8 hours as needed for nausea/vomiting.  ?- Naproxen- this is a nonsteroidal anti-inflammatory medication that will help with pain and swelling. Be sure to take this medication as prescribed with food, 1 pill every 12 hours,  It should be taken with food, as it can cause stomach upset, and more seriously, stomach bleeding. Do not take other nonsteroidal anti-inflammatory medications with this such as Advil, Motrin, Aleve, Mobic, Goodie Powder, or Motrin etc..   ? ?You have some fluid/redness to your ears, this may be viral in nature, if the ear pain does not improve in the next 48 hours please start the augmentin prescribed- this is an antibiotic.  ? ?You make take Tylenol per over the counter dosing with these medications.  ? ?We have prescribed you new medication(s) today. Discuss the medications prescribed today with your pharmacist as they can have adverse effects and interactions with your other medicines including over the counter and prescribed medications. Seek medical evaluation if you start to experience new or abnormal symptoms after taking one of these medicines, seek care immediately if you start to experience difficulty breathing, feeling of your throat closing, facial swelling, or rash as these could be indications of a more serious allergic reaction ? ? ?Follow  up with primary care within 3 days for re-evaluation. Return to the ER for new or worsening symptoms including but not limited to new or worsening pain, inability to keep fluids down, persistent dizziness, passing out, chest pain, blood in vomit/stool, coughing up blood, pain/swelling/redness behind your ear, neck stiffness or any other concerns.  ? ? ? ?

## 2021-09-10 ENCOUNTER — Telehealth: Payer: Self-pay

## 2021-09-10 NOTE — Telephone Encounter (Signed)
Transition Care Management Unsuccessful Follow-up Telephone Call ? ?Date of discharge and from where:  09/09/2021 from [facility] ? ?Attempts:  1st Attempt ? ?Reason for unsuccessful TCM follow-up call:  Left voice message ? ? ? ?

## 2021-09-11 NOTE — Telephone Encounter (Signed)
Transition Care Management Follow-up Telephone Call ?Date of discharge and from where: 09/09/2021 from [facility] ?How have you been since you were released from the hospital? Patient stated that she is feeling much better and did not have any questions or concerns at this time.  ?Any questions or concerns? No ? ?Items Reviewed: ?Did the pt receive and understand the discharge instructions provided? Yes  ?Medications obtained and verified? Yes  ?Other? No  ?Any new allergies since your discharge? No  ?Dietary orders reviewed? No ?Do you have support at home? Yes  ? ?Functional Questionnaire: (I = Independent and D = Dependent) ?ADLs: I ? ?Bathing/Dressing- I ? ?Meal Prep- I ? ?Eating- I ? ?Maintaining continence- I ? ?Transferring/Ambulation- I ? ?Managing Meds- I ? ? ?Follow up appointments reviewed: ? ?PCP Hospital f/u appt confirmed? Yes  Scheduled to see Dionisio David, NP on 09/14/2021 @ 03:40pm. ?[facility] f/u appt confirmed? No   ?Are transportation arrangements needed? No  ?If their condition worsens, is the pt aware to call PCP or go to the Emergency Dept.? Yes ?Was the patient provided with contact information for the PCP's office or ED? Yes ?Was to pt encouraged to call back with questions or concerns? Yes  ?

## 2021-09-14 ENCOUNTER — Encounter: Payer: Self-pay | Admitting: Nurse Practitioner

## 2021-09-14 ENCOUNTER — Other Ambulatory Visit: Payer: Self-pay

## 2021-09-14 ENCOUNTER — Ambulatory Visit (INDEPENDENT_AMBULATORY_CARE_PROVIDER_SITE_OTHER): Payer: Medicaid Other | Admitting: Nurse Practitioner

## 2021-09-14 ENCOUNTER — Other Ambulatory Visit: Payer: Self-pay | Admitting: Nurse Practitioner

## 2021-09-14 DIAGNOSIS — Q524 Other congenital malformations of vagina: Secondary | ICD-10-CM | POA: Diagnosis not present

## 2021-09-14 DIAGNOSIS — R630 Anorexia: Secondary | ICD-10-CM | POA: Diagnosis not present

## 2021-09-14 MED ORDER — EXCEDRIN MIGRAINE 250-250-65 MG PO TABS: 1.0000 | ORAL_TABLET | Freq: Four times a day (QID) | ORAL | 0 refills | Status: DC | PRN

## 2021-09-14 MED ORDER — MIRTAZAPINE 7.5 MG PO TABS: 7.5000 mg | ORAL_TABLET | Freq: Every day | ORAL | 2 refills | Status: DC

## 2021-09-14 NOTE — Telephone Encounter (Signed)
Patient is aware that a new referral has been put in and we can see if they will accept the diagnoses. ?

## 2021-09-14 NOTE — Progress Notes (Signed)
   [facility] [address] [place], [place]  [address] Phone:  [phone]   Fax:  [phone] 
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
## 2021-10-26 ENCOUNTER — Encounter: Payer: Self-pay | Admitting: Nurse Practitioner

## 2021-10-26 ENCOUNTER — Ambulatory Visit: Payer: Medicaid Other | Admitting: Nurse Practitioner

## 2021-10-26 VITALS — BP 138/88 | HR 94 | Temp 97.7°F | Ht 63.0 in | Wt 115.0 lb

## 2021-10-26 DIAGNOSIS — R42 Dizziness and giddiness: Secondary | ICD-10-CM

## 2021-10-26 DIAGNOSIS — N926 Irregular menstruation, unspecified: Secondary | ICD-10-CM

## 2021-10-26 DIAGNOSIS — R630 Anorexia: Secondary | ICD-10-CM

## 2021-10-26 LAB — POCT URINALYSIS DIP (CLINITEK)
Bilirubin, UA: NEGATIVE
Blood, UA: NEGATIVE
Glucose, UA: NEGATIVE mg/dL
Ketones, POC UA: NEGATIVE mg/dL
Nitrite, UA: NEGATIVE
POC PROTEIN,UA: NEGATIVE
Spec Grav, UA: 1.01 (ref 1.010–1.025)
Urobilinogen, UA: 0.2 E.U./dL
pH, UA: 5.5 (ref 5.0–8.0)

## 2021-10-26 LAB — POCT URINE PREGNANCY: Preg Test, Ur: NEGATIVE

## 2021-10-26 NOTE — Assessment & Plan Note (Signed)
-   POCT urine pregnancy ? ?2. Dizziness ? ?-improved - stay well hydrated ? ? ?3. Loss of appetite ? ?-Improved ? ?-May wean off Remeron ? ? ?Follow up: ? ?Follow up in 6 months or sooner if needed ? ?

## 2021-10-26 NOTE — Patient Instructions (Addendum)
1. Irregular periods ? ?- POCT urine pregnancy ? ?2. Dizziness ? ?-improved - stay well hydrated ? ? ?3. Loss of appetite ? ?-Improved ? ?-May wean off Remeron ? ? ?Follow up: ? ?Follow up in 6 months or sooner if needed ? ?
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
## 2021-10-27 DIAGNOSIS — N926 Irregular menstruation, unspecified: Secondary | ICD-10-CM | POA: Diagnosis not present

## 2021-10-29 LAB — URINE CULTURE: Organism ID, Bacteria: NO GROWTH

## 2022-01-05 ENCOUNTER — Encounter (HOSPITAL_COMMUNITY): Payer: Self-pay

## 2022-01-05 ENCOUNTER — Ambulatory Visit (HOSPITAL_COMMUNITY)
Admission: EM | Admit: 2022-01-05 | Discharge: 2022-01-05 | Disposition: A | Discharge status: Home / Self Care | Payer: Medicaid Other | Attending: Physician Assistant | Admitting: Physician Assistant

## 2022-01-05 DIAGNOSIS — J019 Acute sinusitis, unspecified: Secondary | ICD-10-CM

## 2022-01-05 MED ORDER — AMOXICILLIN-POT CLAVULANATE 875-125 MG PO TABS: 1.0000 | ORAL_TABLET | Freq: Two times a day (BID) | ORAL | 0 refills | Status: DC

## 2022-01-05 NOTE — ED Triage Notes (Signed)
Pt c/o rt ear pain when swallowing and nasal congestion with sinus pressure x1wk. Taking OTC meds with little relief.
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
## 2022-01-05 NOTE — Discharge Instructions (Addendum)
Take antibiotic as prescribed Recommend Flonase, can take a daily allergy medication as well like Zyrtec or Claritin.  Can take Tylenol or Ibuprofen as needed for pain.  Drink plenty of fluids

## 2022-01-29 ENCOUNTER — Emergency Department (HOSPITAL_COMMUNITY)
Admission: EM | Admit: 2022-01-29 | Discharge: 2022-01-30 | Disposition: A | Discharge status: Home / Self Care | Payer: Medicaid Other | Attending: Emergency Medicine | Admitting: Emergency Medicine

## 2022-01-29 DIAGNOSIS — R0981 Nasal congestion: Secondary | ICD-10-CM | POA: Diagnosis not present

## 2022-01-29 DIAGNOSIS — Z20822 Contact with and (suspected) exposure to covid-19: Secondary | ICD-10-CM | POA: Diagnosis not present

## 2022-01-29 DIAGNOSIS — R059 Cough, unspecified: Secondary | ICD-10-CM

## 2022-01-29 DIAGNOSIS — J029 Acute pharyngitis, unspecified: Secondary | ICD-10-CM | POA: Insufficient documentation

## 2022-01-30 ENCOUNTER — Encounter (HOSPITAL_COMMUNITY): Payer: Self-pay | Admitting: Emergency Medicine

## 2022-01-30 ENCOUNTER — Other Ambulatory Visit: Payer: Self-pay

## 2022-01-30 ENCOUNTER — Emergency Department (HOSPITAL_COMMUNITY): Payer: Medicaid Other

## 2022-01-30 DIAGNOSIS — R059 Cough, unspecified: Secondary | ICD-10-CM | POA: Diagnosis not present

## 2022-01-30 LAB — RESP PANEL BY RT-PCR (FLU A&B, COVID) ARPGX2
Influenza A by PCR: NEGATIVE
Influenza B by PCR: NEGATIVE
SARS Coronavirus 2 by RT PCR: NEGATIVE

## 2022-01-30 LAB — GROUP A STREP BY PCR: Group A Strep by PCR: NOT DETECTED

## 2022-01-30 MED ORDER — CETIRIZINE HCL 10 MG PO TABS: 10.0000 mg | ORAL_TABLET | Freq: Every day | ORAL | 2 refills | Status: DC

## 2022-01-30 NOTE — ED Triage Notes (Signed)
Patient reports itchy sore throat , dry cough with nasal congestion /rhinorrhea onset this week .

## 2022-01-30 NOTE — ED Notes (Signed)
Pt sitting in chair, pt states that she is ready to go home, pt verbalized understanding d/c and follow up. Pt from dpt.

## 2022-01-30 NOTE — ED Provider Triage Note (Signed)
Emergency Medicine Provider Triage Evaluation Note  Barbara Mckinney , a 26 y.o. female  was evaluated in triage.  Pt complains of sore throat, dry cough, and generally feeling unwell x3 days.  Reports sister sick with flu recently.  No fever, chills, N/V/D.  Review of Systems  Positive: Sore throat, dry cough Negative: fever  Physical Exam  BP (!) 131/97   Pulse 90   Temp 98.1 F (36.7 C) (Oral)   Resp 16   LMP 12/06/2021   SpO2 100%  Gen:   Awake, no distress   Resp:  Normal effort  MSK:   Moves extremities without difficulty  Other:  No tonsillar edema or exudates, handling secretions, no stridor  Medical Decision Making  Medically screening exam initiated at 12:13 AM.  Appropriate orders placed.  Barbara Mckinney was informed that the remainder of the evaluation will be completed by another provider, this initial triage assessment does not replace that evaluation, and the importance of remaining in the ED until their evaluation is complete.  Sore throat.  Recent flu + contact.  Rapid strep, covid/flu screen sent.   Barbara Hatchet, PA-C 01/30/22 [phone]

## 2022-01-30 NOTE — ED Provider Notes (Signed)
[facility] EMERGENCY DEPARTMENT Provider Note   CSN: [id number] Arrival date & time: 01/29/22  2355     History  Chief Complaint  Patient presents with   Sore Throat    Barbara Mckinney is a 26 y.o. female.  26 year old female who presents for sore throat, cough, sinus pressure for the past 3 days.  Recent sick contact and sister with flu.  No known fever.  No vomiting, no diarrhea.  Patient states she has had sinus pressure for approximately 1 year.  Seems to be getting worse over the past few days.  Patient has been on antibiotics recently about 3 weeks ago, and she was able to take the antibiotic for about 4 days and symptoms improved and she stopped taking the medication.  The symptoms have returned.    The history is provided by the patient. No language interpreter was used.  Sore Throat This is a new problem. The current episode started more than 2 days ago. The problem occurs constantly. The problem has not changed since onset.Associated symptoms include headaches. Pertinent negatives include no chest pain, no abdominal pain and no shortness of breath. The symptoms are aggravated by swallowing. Nothing relieves the symptoms. She has tried nothing for the symptoms.       Home Medications Prior to Admission medications   Medication Sig Start Date End Date Taking? Authorizing Provider  cetirizine (ZYRTEC ALLERGY) 10 MG tablet Take 1 tablet (10 mg total) by mouth daily. 01/30/22 04/30/22 Yes Niel Hummer, MD  acetaminophen (TYLENOL) 500 MG tablet Take 1,000 mg by mouth every 6 (six) hours as needed for moderate pain or headache.    [provider]  amoxicillin (AMOXIL) 875 MG tablet Take 1 tablet (875 mg total) by mouth 2 (two) times daily. Patient not taking: Reported on 10/26/2021 09/09/21   Petrucelli, Lelon Mast R, PA-C  amoxicillin-clavulanate (AUGMENTIN) 875-125 MG tablet Take 1 tablet by mouth every 12 (twelve) hours. 01/05/22   Ward, Tylene Fantasia, PA-C   aspirin-acetaminophen-caffeine (EXCEDRIN MIGRAINE) [phone] MG tablet Take 1 tablet by mouth every 6 (six) hours as needed for headache. 09/14/21   [name], NP  meclizine (ANTIVERT) 12.5 MG tablet Take 1-2 tablets (12.5-25 mg total) by mouth 3 (three) times daily as needed for dizziness. Patient not taking: Reported on 10/26/2021 09/09/21   Petrucelli, Lelon Mast R, PA-C  mirtazapine (REMERON) 7.5 MG tablet Take 1 tablet (7.5 mg total) by mouth at bedtime. 09/14/21 12/13/21  [name], NP  naproxen (NAPROSYN) 500 MG tablet Take 1 tablet (500 mg total) by mouth 2 (two) times daily as needed for moderate pain. Patient not taking: Reported on 10/26/2021 09/09/21   Petrucelli, Lelon Mast R, PA-C  ondansetron (ZOFRAN-ODT) 4 MG disintegrating tablet Take 1 tablet (4 mg total) by mouth every 8 (eight) hours as needed for nausea or vomiting. Patient not taking: Reported on 10/26/2021 09/09/21   Petrucelli, Samantha R, PA-C  Pseudoeph-Doxylamine-DM-APAP (NYQUIL PO) Take 30 mLs by mouth at bedtime as needed (cold symptoms). Patient not taking: Reported on 09/14/2021    [provider]  Pseudoephedrine-APAP-DM (DAYQUIL PO) Take 30 mLs by mouth 3 (three) times daily as needed (cold symptoms). Patient not taking: Reported on 09/14/2021    [provider]  terconazole (TERAZOL 7) 0.4 % vaginal cream Place 1 applicator vaginally at bedtime. Patient not taking: Reported on 09/09/2021 08/12/21   Adam Phenix, MD      Allergies    Bacitracin    Review of Systems  Review of Systems  Respiratory:  Negative for shortness of breath.   Cardiovascular:  Negative for chest pain.  Gastrointestinal:  Negative for abdominal pain.  Neurological:  Positive for headaches.  All other systems reviewed and are negative.   Physical Exam Updated Vital Signs BP 118/89 (BP Location: Left Arm)   Pulse 78   Temp 98.7 F (37.1 C) (Oral)   Resp 17   LMP 01/25/2022 (Exact Date)   SpO2 100%  Physical  Exam Vitals and nursing note reviewed.  Constitutional:      Appearance: She is well-developed.  HENT:     Head: Normocephalic and atraumatic.     Right Ear: External ear normal.     Left Ear: External ear normal.     Mouth/Throat:     Pharynx: Posterior oropharyngeal erythema present. No oropharyngeal exudate.     Comments: Minimal redness, no exudates noted. Eyes:     Conjunctiva/sclera: Conjunctivae normal.  Cardiovascular:     Rate and Rhythm: Normal rate.     Heart sounds: Normal heart sounds.  Pulmonary:     Effort: Pulmonary effort is normal.     Breath sounds: Normal breath sounds. No rhonchi.  Abdominal:     General: Bowel sounds are normal.     Palpations: Abdomen is soft.     Tenderness: There is no abdominal tenderness. There is no rebound.  Musculoskeletal:        General: Normal range of motion.     Cervical back: Normal range of motion and neck supple.  Skin:    General: Skin is warm.  Neurological:     Mental Status: She is alert and oriented to person, place, and time.     ED Results / Procedures / Treatments   Labs (all labs ordered are listed, but only abnormal results are displayed) Labs Reviewed  RESP PANEL BY RT-PCR (FLU A&B, COVID) [id number]  GROUP A STREP BY PCR    EKG None  Radiology DG Chest 2 View  Result Date: 01/30/2022 CLINICAL DATA:  Cough EXAM: CHEST - 2 VIEW COMPARISON:  09/09/2021 FINDINGS: The heart size and mediastinal contours are within normal limits. Both lungs are clear. The visualized skeletal structures are unremarkable. IMPRESSION: No active cardiopulmonary disease. Electronically Signed   By: Duanne Guess D.O.   On: 01/30/2022 08:58    Procedures Procedures    Medications Ordered in ED Medications - No data to display  ED Course/ Medical Decision Making/ A&P                           Medical Decision Making 26 year old female who presents for sore throat, dry cough, not feeling well for the past few days.  No  known fever.  Patient with positive sick contact in sibling who has flu.  We will send rapid strep, will send flu, COVID sting.  Given cough, will obtain chest x-ray to evaluate for any pneumonia.  Trip test negative, negative for COVID and fluids well.. CXR visualized by me and no focal pneumonia noted.  Pt with likely viral syndrome.  Will start on allergy medications for sinus pressure.  Discussed symptomatic care.  Will have follow up with pcp if not improved in 2-3 days.  Discussed signs that warrant sooner reevaluation.   Amount and/or Complexity of Data Reviewed Labs: ordered.    Details: strep negative, covid and flu negative. Radiology: ordered and independent interpretation performed.    Details: This x-ray visualized  by me and no signs of pneumonia on my interpretation.  Risk OTC drugs. Decision regarding hospitalization.          Final Clinical Impression(s) / ED Diagnoses Final diagnoses:  Pharyngitis, unspecified etiology  Sinus congestion  Cough, unspecified type    Rx / DC Orders ED Discharge Orders          Ordered    cetirizine (ZYRTEC ALLERGY) 10 MG tablet  Daily        01/30/22 0911              Louanne Skye, MD 01/30/22 [phone]

## 2022-04-04 IMAGING — US US RENAL
1 series · 14 of 25 positions shown · non-contrast
Comparison: CT abdomen pelvis 01/07/2013

CLINICAL DATA: UTI

EXAM:
RENAL / URINARY TRACT ULTRASOUND COMPLETE

[Series 1: us renal · 14 of 47 slices shown]
[im 1/47]
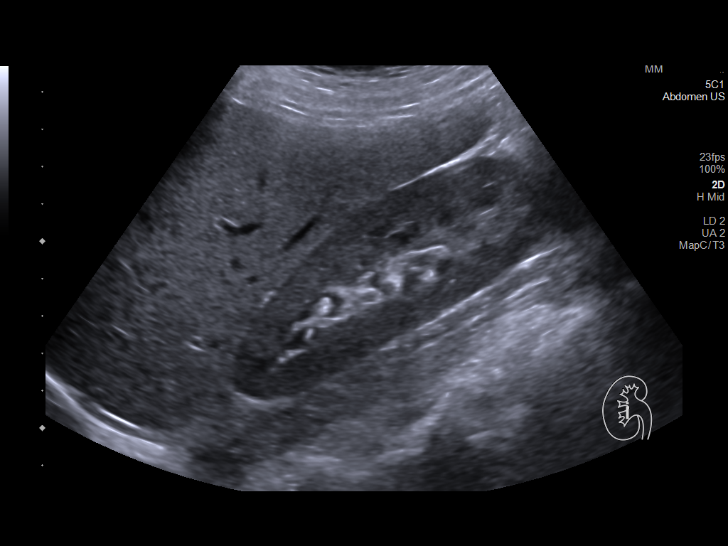
[im 4/47]
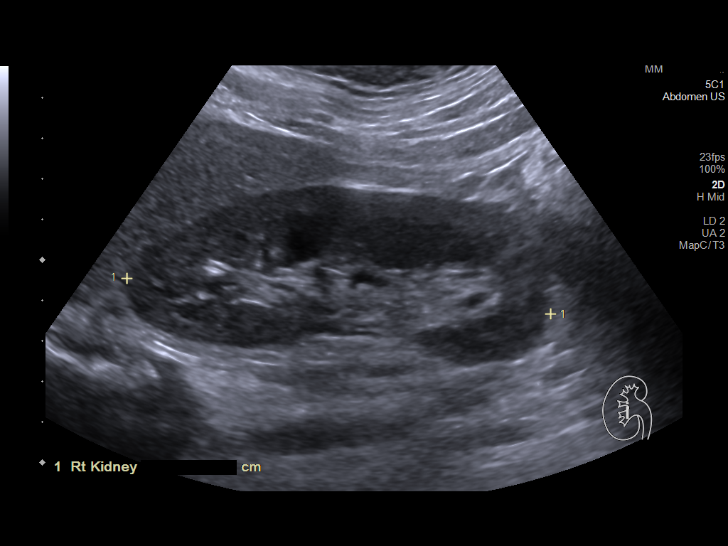
[im 8/47]
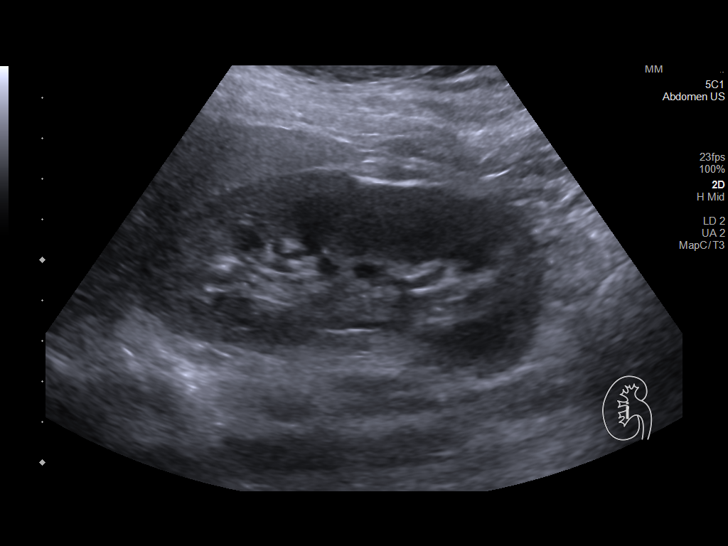
[im 12/47]
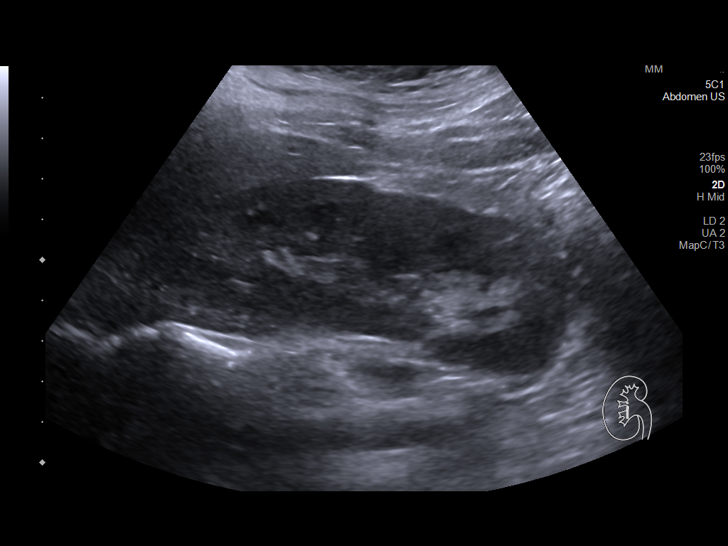
[im 16/47]
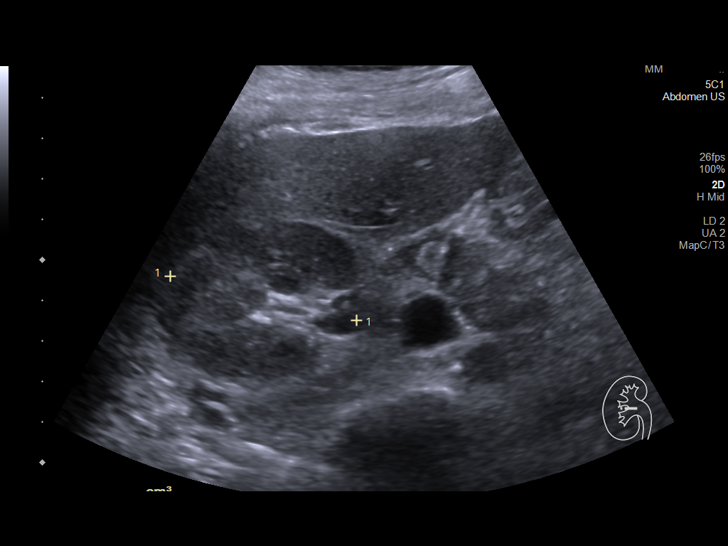
[im 18/47]
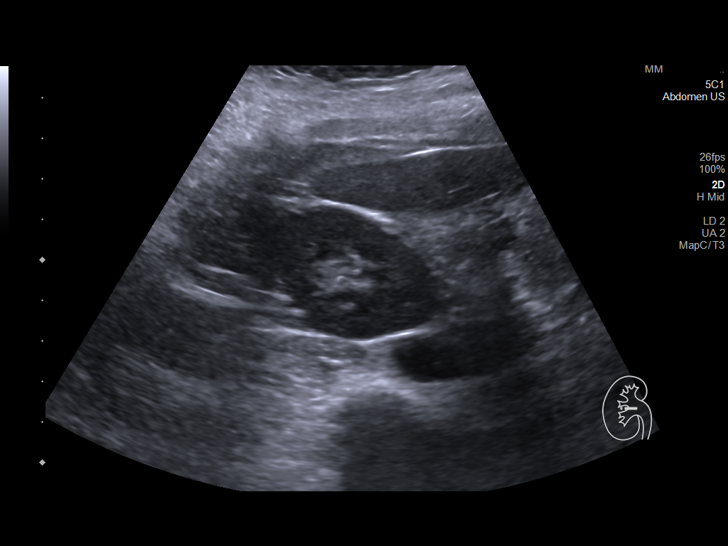
[im 22/47]
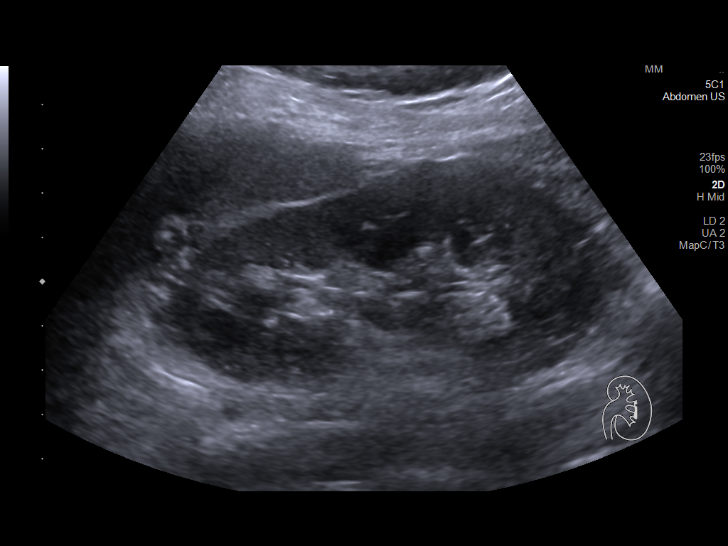
[im 25/47]
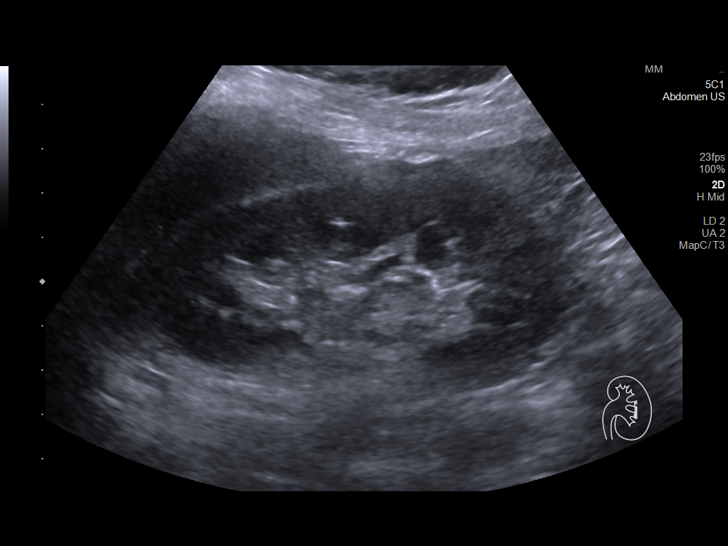
[im 29/47]
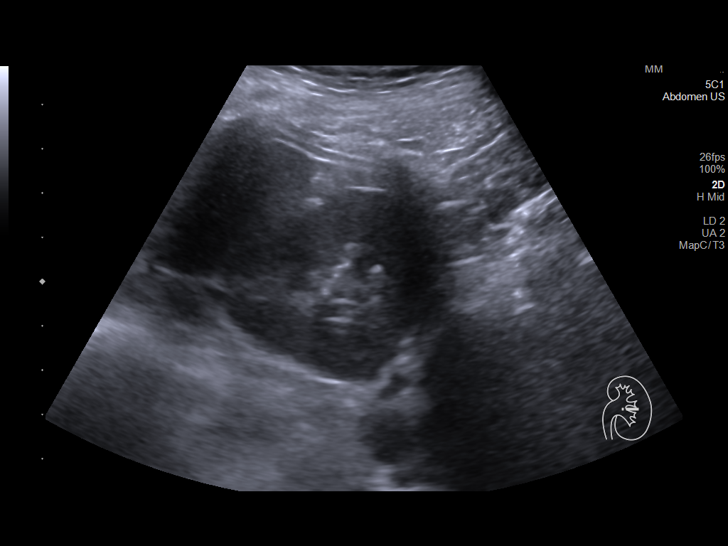
[im 31/47]
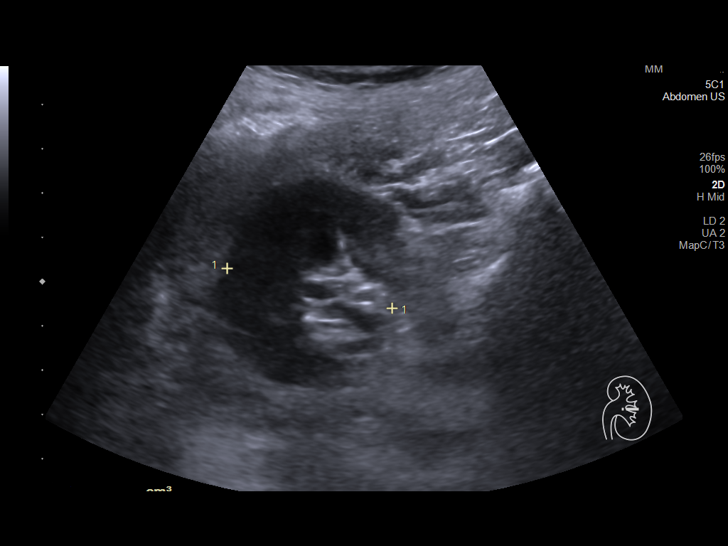
[im 35/47]
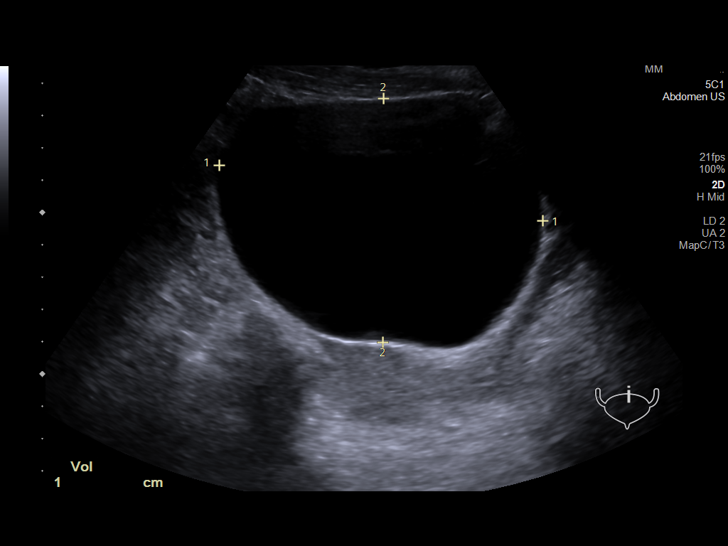
[im 39/47]
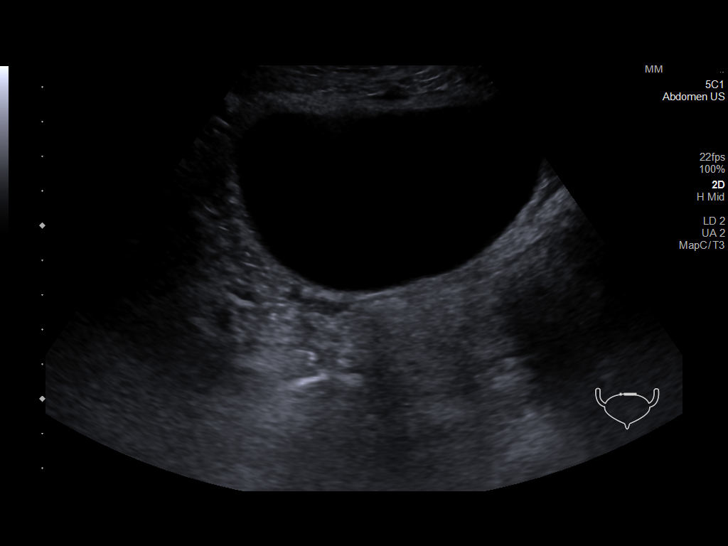
[im 43/47]
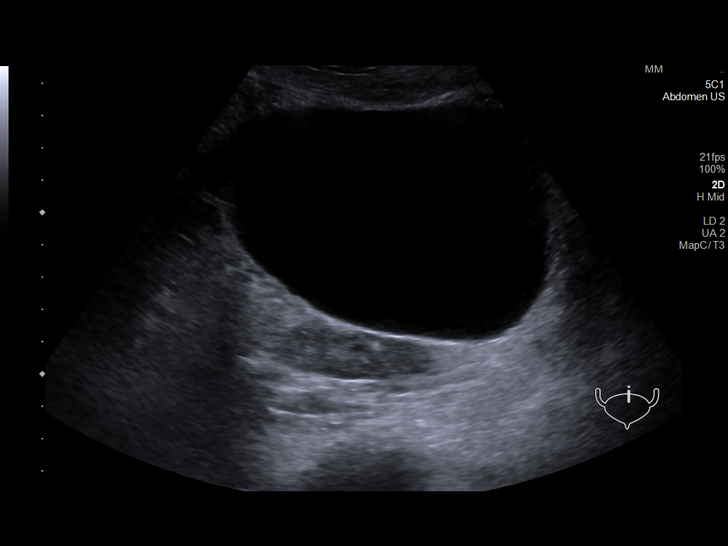
[im 47/47]
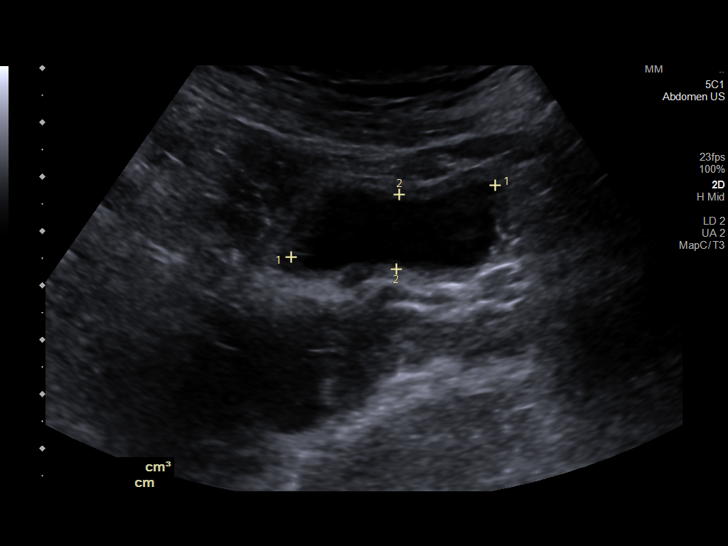

[14 of 25 positions shown; findings below may reference images not displayed]

FINDINGS: Right Kidney:

Renal measurements: 10.5 x 3.6 x 4.7 cm = volume: 93 mL.
Echogenicity within normal limits. No mass or hydronephrosis
visualized.

Left Kidney:

Renal measurements: 10.5 x 4.7 x 3.8 = volume: 100 mL. Echogenicity
within normal limits. No mass or hydronephrosis visualized.

Bladder:

Appears normal for degree of bladder distention.

Other:

None.
IMPRESSION: No significant sonographic abnormality of the kidneys.

## 2022-04-26 ENCOUNTER — Ambulatory Visit: Payer: Medicaid Other | Admitting: Nurse Practitioner

## 2022-05-10 ENCOUNTER — Ambulatory Visit: Payer: Medicaid Other | Admitting: Nurse Practitioner

## 2022-05-10 ENCOUNTER — Encounter: Payer: Self-pay | Admitting: Nurse Practitioner

## 2022-05-10 VITALS — BP 123/82 | HR 88 | Ht 63.0 in | Wt 116.0 lb

## 2022-05-10 DIAGNOSIS — J302 Other seasonal allergic rhinitis: Secondary | ICD-10-CM | POA: Diagnosis not present

## 2022-05-10 DIAGNOSIS — Z309 Encounter for contraceptive management, unspecified: Secondary | ICD-10-CM | POA: Diagnosis not present

## 2022-05-10 DIAGNOSIS — Z23 Encounter for immunization: Secondary | ICD-10-CM | POA: Diagnosis not present

## 2022-05-10 MED ORDER — FLUTICASONE PROPIONATE 50 MCG/ACT NA SUSP: 2.0000 | Freq: Every day | NASAL | 6 refills | Status: DC

## 2022-05-10 MED ORDER — CETIRIZINE HCL 10 MG PO TABS: 10.0000 mg | ORAL_TABLET | Freq: Every day | ORAL | 2 refills | Status: DC

## 2022-05-10 NOTE — Patient Instructions (Addendum)
1. Encounter for contraceptive management, unspecified type  - Ambulatory referral to Obstetrics / Gynecology  2. Seasonal allergies  - cetirizine (ZYRTEC ALLERGY) 10 MG tablet; Take 1 tablet (10 mg total) by mouth daily.  Dispense: 30 tablet; Refill: 2 - fluticasone (FLONASE) 50 MCG/ACT nasal spray; Place 2 sprays into both nostrils daily.  Dispense: 16 g; Refill: 6   Follow up:  Follow up in 6 months
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
## 2022-08-11 ENCOUNTER — Ambulatory Visit: Payer: Medicaid Other | Admitting: Nurse Practitioner

## 2022-09-18 ENCOUNTER — Other Ambulatory Visit: Payer: Self-pay | Admitting: Nurse Practitioner

## 2022-09-18 DIAGNOSIS — J302 Other seasonal allergic rhinitis: Secondary | ICD-10-CM

## 2022-11-08 ENCOUNTER — Ambulatory Visit: Payer: Medicaid Other | Admitting: Nurse Practitioner

## 2022-11-08 ENCOUNTER — Encounter: Payer: Self-pay | Admitting: Nurse Practitioner

## 2022-11-08 VITALS — BP 117/89 | HR 90 | Temp 98.0°F | Wt 123.4 lb

## 2022-11-08 DIAGNOSIS — J302 Other seasonal allergic rhinitis: Secondary | ICD-10-CM

## 2022-11-08 DIAGNOSIS — N926 Irregular menstruation, unspecified: Secondary | ICD-10-CM | POA: Diagnosis not present

## 2022-11-08 LAB — POCT URINE PREGNANCY: Preg Test, Ur: NEGATIVE

## 2022-11-08 MED ORDER — CETIRIZINE HCL 10 MG PO TABS: ORAL_TABLET | ORAL | 2 refills | Status: DC

## 2022-11-08 NOTE — Patient Instructions (Addendum)
1. Irregular menstrual cycle  - POCT urine pregnancy  Follow up:  Follow up as needed   Contraception Choices Contraception refers to things you do or use to prevent pregnancy. It is also called birth control. There are several methods of birth control. Talk to your doctor about the best method for you. Hormonal birth control This kind of birth control uses hormones. Here are some types of hormonal birth control: A tube that is put under the skin of your arm (implant). The tube can stay in for up to 3 years. Shots you get every 3 months. Pills you take every day. A patch you change 1 time each week for 3 weeks. After that, the patch is taken off for 1 week. A ring you put in the vagina. The ring is left in for 3 weeks. Then it is taken out of the vagina for 1 week. Then a new ring is put in. Pills you take after unprotected sex. These are called emergency birth control pills. Barrier birth control Here are some types of barrier birth control: A thin covering that is put on the penis before sex (female condom). The covering is thrown away after sex. A soft, loose covering that is put in the vagina before sex (female condom). The covering is thrown away after sex. A rubber bowl that sits over the cervix (diaphragm). The bowl must be made for you. The bowl is put into the vagina before sex. The bowl is left in for 6-8 hours after sex. It is taken out within 24 hours. A small, soft cup that fits over the cervix (cervical cap). The cup must be made for you. The cup should be left in for 6-8 hours after sex. It is taken out within 48 hours. A sponge that is put into the vagina before sex. It must be left in for at least 6 hours after sex. It must be taken out within 30 hours and thrown away. A chemical that kills or stops sperm from getting into the womb (uterus). This chemical is called a spermicide. It may be a pill, cream, jelly, or foam to put in the vagina. The chemical should be used at least  10-15 minutes before sex. IUD birth control IUD means "intrauterine device." It is put inside the womb. There are two kinds: Hormone IUD. This kind can stay in the womb for 3-5 years. Copper IUD. This kind can stay in the womb for 10 years. Permanent birth control Here are some types of permanent birth control: Surgery to block the fallopian tubes. Having an insert put into each fallopian tube. This method takes 3 months to work. Other forms of birth control must be used for 3 months. Surgery to tie off the tubes that carry sperm in men (vasectomy). This method takes 3 months to work. Other forms of birth control must be used for 3 months. Natural planning birth control Here are some types of natural planning birth control: Not having sex on the days the woman could get pregnant. Using a calendar: To keep track of the length of each menstrual cycle. To find out what days pregnancy can happen. To plan to not have sex on days when pregnancy can happen. Watching for signs of ovulation and not having sex during this time. One way the woman can check for ovulation is to check her temperature. Waiting to have sex until after ovulation. Where to find more information Centers for Disease Control and Prevention: [employer] Summary Contraception, also called  birth control, refers to things you do or use to prevent pregnancy. Hormonal methods of birth control include implants, injections, pills, patches, vaginal rings, and emergency birth control pills. Barrier methods of birth control can include female condoms, female condoms, diaphragms, cervical caps, sponges, and spermicides. There are two types of IUD (intrauterine device) birth control. An IUD can be put in a woman's womb to prevent pregnancy for several years. Permanent birth control can be done through a procedure for males, females, or both. Natural planning means not having sex when the woman could get pregnant. This information is not  intended to replace advice given to you by your health care provider. Make sure you discuss any questions you have with your health care provider. Document Revised: 11/12/2019 Document Reviewed: 11/12/2019 Elsevier Patient Education  2023 [employer].
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
## 2022-11-10 DIAGNOSIS — N926 Irregular menstruation, unspecified: Secondary | ICD-10-CM | POA: Insufficient documentation

## 2022-11-10 NOTE — Assessment & Plan Note (Signed)
-   POCT urine pregnancy  Follow up:  Follow up as needed

## 2022-12-30 ENCOUNTER — Other Ambulatory Visit: Payer: Self-pay | Admitting: Nurse Practitioner

## 2022-12-30 DIAGNOSIS — J302 Other seasonal allergic rhinitis: Secondary | ICD-10-CM

## 2023-02-15 ENCOUNTER — Other Ambulatory Visit: Payer: Self-pay

## 2023-02-15 DIAGNOSIS — J302 Other seasonal allergic rhinitis: Secondary | ICD-10-CM

## 2023-02-15 MED ORDER — CETIRIZINE HCL 10 MG PO TABS: ORAL_TABLET | ORAL | 0 refills | Status: DC

## 2023-08-05 ENCOUNTER — Encounter (HOSPITAL_COMMUNITY): Payer: Self-pay

## 2023-08-05 ENCOUNTER — Ambulatory Visit (HOSPITAL_COMMUNITY)
Admission: EM | Admit: 2023-08-05 | Discharge: 2023-08-05 | Disposition: A | Discharge status: Home / Self Care | Payer: Medicaid Other | Attending: Emergency Medicine | Admitting: Emergency Medicine

## 2023-08-05 DIAGNOSIS — F32A Depression, unspecified: Secondary | ICD-10-CM | POA: Diagnosis not present

## 2023-08-05 DIAGNOSIS — J101 Influenza due to other identified influenza virus with other respiratory manifestations: Secondary | ICD-10-CM | POA: Diagnosis not present

## 2023-08-05 DIAGNOSIS — F419 Anxiety disorder, unspecified: Secondary | ICD-10-CM

## 2023-08-05 LAB — POC COVID19/FLU A&B COMBO
Covid Antigen, POC: NEGATIVE
Influenza A Antigen, POC: NEGATIVE
Influenza B Antigen, POC: POSITIVE — AB

## 2023-08-05 MED ORDER — MIRTAZAPINE 7.5 MG PO TABS: 7.5000 mg | ORAL_TABLET | Freq: Every day | ORAL | 2 refills | Status: DC

## 2023-08-05 MED ORDER — OSELTAMIVIR PHOSPHATE 75 MG PO CAPS: 75.0000 mg | ORAL_CAPSULE | Freq: Two times a day (BID) | ORAL | 0 refills | Status: DC

## 2023-08-05 NOTE — Discharge Instructions (Addendum)
You tested positive for flu B today. Start taking Tamiflu twice daily for 5 days. Alternate between ibuprofen and Tylenol as needed for body aches and fever. You can take any over-the-counter cough and congestion. Stay hydrated and get plenty of rest. Return here if symptoms persist or worsen.   I have re-prescribed your mirtazapine with 2 refills for depression and anxiety. I recommend following up with [facility] for further management of depression and anxiety.

## 2023-08-05 NOTE — ED Triage Notes (Addendum)
Pt c/o sore throat, dry cough, chills, body aches, fever, nausea, headache, dizziness, weakness, nausea, and nasal congestion.   Pt states sometimes she has anxiety that causes shaking, difficulty breathing, and panic attacks since coming off her anxiety medication.   Pt also states for about two weeks she has had random bruises show up and she is unsure of where they come from .  Start date: 08/03/2023   Home Interventions: Ibuprofen
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
## 2023-10-05 ENCOUNTER — Other Ambulatory Visit: Payer: Self-pay | Admitting: Nurse Practitioner

## 2023-10-05 DIAGNOSIS — J302 Other seasonal allergic rhinitis: Secondary | ICD-10-CM

## 2023-10-24 ENCOUNTER — Ambulatory Visit (INDEPENDENT_AMBULATORY_CARE_PROVIDER_SITE_OTHER): Payer: Self-pay | Admitting: Nurse Practitioner

## 2023-10-24 ENCOUNTER — Encounter: Payer: Self-pay | Admitting: Nurse Practitioner

## 2023-10-24 ENCOUNTER — Other Ambulatory Visit (HOSPITAL_COMMUNITY)
Admission: RE | Admit: 2023-10-24 | Discharge: 2023-10-24 | Disposition: A | Discharge status: Home / Self Care | Source: Ambulatory Visit | Attending: Nurse Practitioner | Admitting: Nurse Practitioner

## 2023-10-24 VITALS — BP 117/77 | HR 104 | Temp 99.1°F | Wt 120.8 lb

## 2023-10-24 DIAGNOSIS — Z113 Encounter for screening for infections with a predominantly sexual mode of transmission: Secondary | ICD-10-CM | POA: Diagnosis present

## 2023-10-24 DIAGNOSIS — Z0000 Encounter for general adult medical examination without abnormal findings: Secondary | ICD-10-CM | POA: Diagnosis not present

## 2023-10-24 DIAGNOSIS — Z124 Encounter for screening for malignant neoplasm of cervix: Secondary | ICD-10-CM

## 2023-10-24 DIAGNOSIS — Z1329 Encounter for screening for other suspected endocrine disorder: Secondary | ICD-10-CM | POA: Diagnosis not present

## 2023-10-24 DIAGNOSIS — Z13220 Encounter for screening for lipoid disorders: Secondary | ICD-10-CM | POA: Diagnosis not present

## 2023-10-24 NOTE — Patient Instructions (Signed)
 1. Screen for STD (sexually transmitted disease) (Primary)  - NuSwab Vaginitis Plus (VG+) - Chlamydia/Gonococcus/Trichomonas, NAA - RPR+HIV+GC+CT Panel  2. Lipid screening  - Lipid Panel  3. Routine adult health maintenance  - CBC - Comprehensive metabolic panel with GFR  4. Thyroid  disorder screen  - TSH
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
## 2023-10-26 LAB — NUSWAB VAGINITIS PLUS (VG+)
Candida albicans, NAA: NEGATIVE
Candida glabrata, NAA: NEGATIVE
Chlamydia trachomatis, NAA: NEGATIVE
Neisseria gonorrhoeae, NAA: NEGATIVE
Trich vag by NAA: NEGATIVE

## 2023-10-26 LAB — COMPREHENSIVE METABOLIC PANEL WITH GFR
ALT: 13 IU/L (ref 0–32)
AST: 16 IU/L (ref 0–40)
Albumin: 5 g/dL (ref 4.0–5.0)
Alkaline Phosphatase: 102 IU/L (ref 44–121)
BUN/Creatinine Ratio: 22 (ref 9–23)
BUN: 18 mg/dL (ref 6–20)
Bilirubin Total: 0.3 mg/dL (ref 0.0–1.2)
CO2: 19 mmol/L — ABNORMAL LOW (ref 20–29)
Calcium: 10.4 mg/dL — ABNORMAL HIGH (ref 8.7–10.2)
Chloride: 100 mmol/L (ref 96–106)
Creatinine, Ser: 0.81 mg/dL (ref 0.57–1.00)
Globulin, Total: 2.9 g/dL (ref 1.5–4.5)
Glucose: 122 mg/dL — ABNORMAL HIGH (ref 70–99)
Potassium: 3.8 mmol/L (ref 3.5–5.2)
Sodium: 140 mmol/L (ref 134–144)
Total Protein: 7.9 g/dL (ref 6.0–8.5)
eGFR: 101 mL/min/{1.73_m2} (ref >59)

## 2023-10-26 LAB — CBC
Hematocrit: 46 % (ref 34.0–46.6)
Hemoglobin: 15.2 g/dL (ref 11.1–15.9)
MCH: 31.7 pg (ref 26.6–33.0)
MCHC: 33 g/dL (ref 31.5–35.7)
MCV: 96 fL (ref 79–97)
Platelets: 299 10*3/uL (ref 150–450)
RBC: 4.8 x10E6/uL (ref 3.77–5.28)
RDW: 12 % (ref 11.7–15.4)
WBC: 8.6 10*3/uL (ref 3.4–10.8)

## 2023-10-26 LAB — LIPID PANEL
Chol/HDL Ratio: 3.2 ratio (ref 0.0–4.4)
Cholesterol, Total: 161 mg/dL (ref 100–199)
HDL: 51 mg/dL (ref >39)
LDL Chol Calc (NIH): 77 mg/dL (ref 0–99)
Triglycerides: 196 mg/dL — ABNORMAL HIGH (ref 0–149)
VLDL Cholesterol Cal: 33 mg/dL (ref 5–40)

## 2023-10-26 LAB — RPR+HIV+GC+CT PANEL: RPR Ser Ql: NONREACTIVE

## 2023-10-26 LAB — TSH: TSH: 0.951 u[IU]/mL (ref 0.450–4.500)

## 2023-11-02 ENCOUNTER — Ambulatory Visit: Payer: Self-pay

## 2023-11-02 LAB — CYTOLOGY - PAP
Chlamydia: NEGATIVE
Comment: NEGATIVE
Comment: NEGATIVE
Comment: NEGATIVE
Comment: NEGATIVE
Comment: NORMAL
Diagnosis: NEGATIVE
HSV1: NEGATIVE
HSV2: NEGATIVE
High risk HPV: NEGATIVE
Neisseria Gonorrhea: NEGATIVE
Trichomonas: NEGATIVE

## 2024-02-05 ENCOUNTER — Encounter (HOSPITAL_COMMUNITY): Payer: Self-pay

## 2024-02-05 ENCOUNTER — Ambulatory Visit (HOSPITAL_COMMUNITY)
Admission: EM | Admit: 2024-02-05 | Discharge: 2024-02-05 | Disposition: A | Discharge status: Home / Self Care | Attending: Physician Assistant | Admitting: Physician Assistant

## 2024-02-05 DIAGNOSIS — S1016XA Insect bite (nonvenomous) of throat, initial encounter: Secondary | ICD-10-CM | POA: Diagnosis not present

## 2024-02-05 DIAGNOSIS — W57XXXA Bitten or stung by nonvenomous insect and other nonvenomous arthropods, initial encounter: Secondary | ICD-10-CM

## 2024-02-05 MED ORDER — METHYLPREDNISOLONE SODIUM SUCC 125 MG IJ SOLR
80.0000 mg | Freq: Once | INTRAMUSCULAR | Status: AC
  Administered 2024-02-05: 80 mg via INTRAMUSCULAR

## 2024-02-05 MED ORDER — METHYLPREDNISOLONE SODIUM SUCC 125 MG IJ SOLR
INTRAMUSCULAR | Status: AC
  Filled 2024-02-05: qty 2

## 2024-02-05 NOTE — ED Triage Notes (Signed)
 Onset 3 hours ago was stung by an unknown insect on the mid anterior neck. Now having increased swelling, pain, and stiffness in the neck.   Patient tried an otc topical from her father, unsure what it was, with no relief.
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
## 2024-02-15 ENCOUNTER — Other Ambulatory Visit: Payer: Self-pay | Admitting: Nurse Practitioner

## 2024-02-15 DIAGNOSIS — J302 Other seasonal allergic rhinitis: Secondary | ICD-10-CM

## 2024-02-15 MED ORDER — CETIRIZINE HCL 10 MG PO TABS: ORAL_TABLET | ORAL | 0 refills | Status: DC

## 2024-02-15 NOTE — Telephone Encounter (Signed)
 Copied from CRM #[id number]. Topic: Clinical - Medication Refill >> Feb 15, 2024  2:17 PM Turkey B wrote: Medication: cetirizine  (ZYRTEC ) 10 MG tablet  Has the patient contacted their pharmacy? yes (Agent: If yes, when and what did the pharmacy advise?)contact pcp  This is the patient's preferred pharmacy:  [id number] [place], [place] - [address] AT [facility] [address] [facility] [address] [place] [address] Phone: [phone] Fax: [phone]   Is this the correct pharmacy for this prescription? yes   Has the prescription been filled recently? no  Is the patient out of the medication? yes  Has the patient been seen for an appointment in the last year OR does the patient have an upcoming appointment? yes  Can we respond through MyChart? yes  Agent: Please be advised that Rx refills may take up to 3 business days. We ask that you follow-up with your pharmacy.

## 2024-02-17 ENCOUNTER — Other Ambulatory Visit: Payer: Self-pay | Admitting: Nurse Practitioner

## 2024-02-17 DIAGNOSIS — J302 Other seasonal allergic rhinitis: Secondary | ICD-10-CM

## 2024-03-31 ENCOUNTER — Ambulatory Visit (HOSPITAL_COMMUNITY)
Admission: EM | Admit: 2024-03-31 | Discharge: 2024-03-31 | Disposition: A | Discharge status: Home / Self Care | Attending: Family Medicine | Admitting: Family Medicine

## 2024-03-31 ENCOUNTER — Inpatient Hospital Stay (HOSPITAL_COMMUNITY)
Admission: AD | Admit: 2024-03-31 | Discharge: 2024-03-31 | Disposition: A | Discharge status: Home / Self Care | Attending: Obstetrics & Gynecology | Admitting: Obstetrics & Gynecology

## 2024-03-31 ENCOUNTER — Inpatient Hospital Stay (HOSPITAL_COMMUNITY)

## 2024-03-31 ENCOUNTER — Encounter (HOSPITAL_COMMUNITY): Payer: Self-pay

## 2024-03-31 DIAGNOSIS — Z3A01 Less than 8 weeks gestation of pregnancy: Secondary | ICD-10-CM | POA: Diagnosis not present

## 2024-03-31 DIAGNOSIS — Z3491 Encounter for supervision of normal pregnancy, unspecified, first trimester: Secondary | ICD-10-CM

## 2024-03-31 DIAGNOSIS — O219 Vomiting of pregnancy, unspecified: Secondary | ICD-10-CM | POA: Insufficient documentation

## 2024-03-31 DIAGNOSIS — R1031 Right lower quadrant pain: Secondary | ICD-10-CM

## 2024-03-31 DIAGNOSIS — O26891 Other specified pregnancy related conditions, first trimester: Secondary | ICD-10-CM

## 2024-03-31 DIAGNOSIS — Z113 Encounter for screening for infections with a predominantly sexual mode of transmission: Secondary | ICD-10-CM | POA: Diagnosis present

## 2024-03-31 DIAGNOSIS — N926 Irregular menstruation, unspecified: Secondary | ICD-10-CM

## 2024-03-31 LAB — POCT URINALYSIS DIP (MANUAL ENTRY)
Bilirubin, UA: NEGATIVE
Blood, UA: NEGATIVE
Glucose, UA: NEGATIVE mg/dL
Ketones, POC UA: NEGATIVE mg/dL
Leukocytes, UA: NEGATIVE
Nitrite, UA: NEGATIVE
Protein Ur, POC: NEGATIVE mg/dL
Spec Grav, UA: 1.02 (ref 1.010–1.025)
Urobilinogen, UA: 0.2 U/dL
pH, UA: 6 (ref 5.0–8.0)

## 2024-03-31 LAB — WET PREP, GENITAL
Clue Cells Wet Prep HPF POC: NONE SEEN
Sperm: NONE SEEN
Trich, Wet Prep: NONE SEEN
WBC, Wet Prep HPF POC: <10 (ref <10)
Yeast Wet Prep HPF POC: NONE SEEN

## 2024-03-31 LAB — CBC
HCT: 40.4 % (ref 36.0–46.0)
Hemoglobin: 13.7 g/dL (ref 12.0–15.0)
MCH: 31.4 pg (ref 26.0–34.0)
MCHC: 33.9 g/dL (ref 30.0–36.0)
MCV: 92.4 fL (ref 80.0–100.0)
Platelets: 246 K/uL (ref 150–400)
RBC: 4.37 MIL/uL (ref 3.87–5.11)
RDW: 12.3 % (ref 11.5–15.5)
WBC: 10.1 K/uL (ref 4.0–10.5)
nRBC: 0 % (ref 0.0–0.2)

## 2024-03-31 LAB — POCT URINE PREGNANCY: Preg Test, Ur: POSITIVE — AB

## 2024-03-31 LAB — HCG, QUANTITATIVE, PREGNANCY: hCG, Beta Chain, Quant, S: 35503 m[IU]/mL — ABNORMAL HIGH (ref <5)

## 2024-03-31 NOTE — ED Triage Notes (Signed)
 Pt states that she has some nausea, vomiting, and dizziness. X2 days

## 2024-03-31 NOTE — MAU Note (Signed)
 Barbara Mckinney is a 28 y.o. at Unknown here in MAU reporting: found out she was pregnant today at [facility] Reports feeling right lower quadrant pain the most and sometimes left. Reports only feeling the pain with physical activity. Denies any vaginal bleeding. Patient denies any unusual vaginal discharge, vaginal odor, itching or pain. Denies recent sexual intercourse. Nausea, vomiting and dizziness since Thursday- that is why she went to urgent care.   LMP: unsure, possibly end of August or beginning of September Onset of complaint: 10 days Pain score: 0 Vitals:   03/31/24 1725  BP: 135/86  Pulse: 99  Resp: 18  Temp: 98.9 F (37.2 C)  SpO2: 98%     FHT:n/a Lab orders placed from triage: ectopic workup
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
## 2024-03-31 NOTE — Discharge Instructions (Signed)

## 2024-03-31 NOTE — Discharge Instructions (Signed)
 Your pregnancy test was positive  The urinalysis was negative and did not show any sign of urinary infection.  Please go to the maternal admissions unit at [facility] for further evaluation
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
## 2024-04-01 LAB — RPR: RPR Ser Ql: NONREACTIVE

## 2024-04-02 LAB — GC/CHLAMYDIA PROBE AMP (~~LOC~~) NOT AT ARMC
Chlamydia: NEGATIVE
Comment: NEGATIVE
Comment: NORMAL
Neisseria Gonorrhea: NEGATIVE

## 2024-04-06 ENCOUNTER — Encounter: Payer: Self-pay | Admitting: Student

## 2024-04-06 ENCOUNTER — Other Ambulatory Visit: Payer: Self-pay

## 2024-04-06 ENCOUNTER — Inpatient Hospital Stay (HOSPITAL_COMMUNITY)

## 2024-04-06 ENCOUNTER — Inpatient Hospital Stay (HOSPITAL_COMMUNITY)
Admission: AD | Admit: 2024-04-06 | Discharge: 2024-04-06 | Disposition: A | Discharge status: Home / Self Care | Payer: Self-pay | Attending: Obstetrics and Gynecology | Admitting: Obstetrics and Gynecology

## 2024-04-06 DIAGNOSIS — O219 Vomiting of pregnancy, unspecified: Secondary | ICD-10-CM

## 2024-04-06 DIAGNOSIS — Z3A01 Less than 8 weeks gestation of pregnancy: Secondary | ICD-10-CM | POA: Diagnosis not present

## 2024-04-06 LAB — URINALYSIS, ROUTINE W REFLEX MICROSCOPIC
Bilirubin Urine: NEGATIVE
Glucose, UA: NEGATIVE mg/dL
Hgb urine dipstick: NEGATIVE
Ketones, ur: NEGATIVE mg/dL
Leukocytes,Ua: NEGATIVE
Nitrite: NEGATIVE
Protein, ur: NEGATIVE mg/dL
Specific Gravity, Urine: 1.02 (ref 1.005–1.030)
pH: 6 (ref 5.0–8.0)

## 2024-04-06 MED ORDER — ONDANSETRON HCL 4 MG PO TABS: 4.0000 mg | ORAL_TABLET | Freq: Three times a day (TID) | ORAL | 0 refills | Status: AC | PRN

## 2024-04-06 MED ORDER — PROMETHAZINE HCL 12.5 MG PO TABS: 12.5000 mg | ORAL_TABLET | Freq: Four times a day (QID) | ORAL | 0 refills | Status: AC | PRN

## 2024-04-06 NOTE — MAU Note (Signed)
 Barbara Mckinney is a 28 y.o. at [redacted]w[redacted]d here in MAU reporting: she thinks she may be dehydrated because she can't keep anything down.  States she's vomited 10x in past 24 hours.  Also reports HA located on the left side of her head.  States hasn't taken any meds to treat.  Denies VB.    LMP: 02/20/2024 Onset of complaint: last night Pain score: 5 Vitals:   04/06/24 1625  BP: 105/66  Pulse: 83  Resp: 18  Temp: 98.4 F (36.9 C)  SpO2: 98%     FHT: NA  Lab orders placed from triage: UA
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
## 2024-07-04 ENCOUNTER — Other Ambulatory Visit: Payer: Self-pay | Admitting: Nurse Practitioner

## 2024-07-04 DIAGNOSIS — J302 Other seasonal allergic rhinitis: Secondary | ICD-10-CM

## 2024-10-24 ENCOUNTER — Encounter: Payer: Self-pay | Admitting: Nurse Practitioner
